# Patient Record
Sex: Male | Born: 1940
Health system: Southern US, Community
[De-identification: ages and names within clinical notes are randomized; demographics above are authoritative.]

## PROBLEM LIST (undated history)

## (undated) DIAGNOSIS — C801 Malignant (primary) neoplasm, unspecified: Secondary | ICD-10-CM

## (undated) DIAGNOSIS — I2510 Atherosclerotic heart disease of native coronary artery without angina pectoris: Secondary | ICD-10-CM

## (undated) DIAGNOSIS — Z85038 Personal history of other malignant neoplasm of large intestine: Secondary | ICD-10-CM

## (undated) DIAGNOSIS — E785 Hyperlipidemia, unspecified: Secondary | ICD-10-CM

## (undated) DIAGNOSIS — B0229 Other postherpetic nervous system involvement: Principal | ICD-10-CM

## (undated) HISTORY — DX: Hyperlipidemia, unspecified: E78.5

## (undated) HISTORY — PX: LEG SURGERY: SHX1003

## (undated) HISTORY — DX: Atherosclerotic heart disease of native coronary artery without angina pectoris: I25.10

## (undated) HISTORY — DX: Personal history of other malignant neoplasm of large intestine: Z85.038

## (undated) HISTORY — PX: HERNIA REPAIR: SHX51

## (undated) HISTORY — DX: Other postherpetic nervous system involvement: B02.29

---

## 1997-05-20 DIAGNOSIS — C801 Malignant (primary) neoplasm, unspecified: Secondary | ICD-10-CM

## 1997-05-20 HISTORY — PX: COLON SURGERY: SHX602

## 1997-05-20 HISTORY — DX: Malignant (primary) neoplasm, unspecified: C80.1

## 2002-06-07 ENCOUNTER — Ambulatory Visit (HOSPITAL_COMMUNITY): Admission: RE | Admit: 2002-06-07 | Discharge: 2002-06-07 | Payer: Self-pay | Admitting: Internal Medicine

## 2009-04-17 ENCOUNTER — Encounter: Payer: Self-pay | Admitting: Cardiology

## 2009-09-17 HISTORY — PX: CARDIAC CATHETERIZATION: SHX172

## 2009-09-24 ENCOUNTER — Encounter: Payer: Self-pay | Admitting: Physician Assistant

## 2009-09-25 ENCOUNTER — Ambulatory Visit: Payer: Self-pay | Admitting: Internal Medicine

## 2009-09-25 ENCOUNTER — Encounter: Payer: Self-pay | Admitting: Cardiovascular Disease

## 2009-09-25 ENCOUNTER — Inpatient Hospital Stay (HOSPITAL_COMMUNITY): Admission: RE | Admit: 2009-09-25 | Discharge: 2009-09-26 | Payer: Self-pay | Admitting: Internal Medicine

## 2009-09-25 ENCOUNTER — Encounter: Payer: Self-pay | Admitting: Physician Assistant

## 2009-09-26 ENCOUNTER — Encounter: Payer: Self-pay | Admitting: Physician Assistant

## 2009-10-12 ENCOUNTER — Ambulatory Visit: Payer: Self-pay | Admitting: Physician Assistant

## 2009-10-12 DIAGNOSIS — E785 Hyperlipidemia, unspecified: Secondary | ICD-10-CM

## 2009-10-12 DIAGNOSIS — I498 Other specified cardiac arrhythmias: Secondary | ICD-10-CM | POA: Insufficient documentation

## 2009-10-12 DIAGNOSIS — I2510 Atherosclerotic heart disease of native coronary artery without angina pectoris: Secondary | ICD-10-CM | POA: Insufficient documentation

## 2009-12-08 ENCOUNTER — Ambulatory Visit: Payer: Self-pay | Admitting: Cardiology

## 2010-04-10 ENCOUNTER — Encounter: Payer: Self-pay | Admitting: Cardiology

## 2010-04-17 ENCOUNTER — Encounter: Payer: Self-pay | Admitting: Cardiology

## 2010-06-19 ENCOUNTER — Ambulatory Visit
Admission: RE | Admit: 2010-06-19 | Discharge: 2010-06-19 | Payer: Self-pay | Source: Home / Self Care | Attending: Cardiology | Admitting: Cardiology

## 2010-06-19 NOTE — Letter (Signed)
Summary: External Correspondence/ [facility]  External Correspondence/ [facility]   Imported By: Dorise Hiss 04/20/2010 08:17:23  _____________________________________________________________________  External Attachment:    Type:   Image     Comment:   External Document

## 2010-06-19 NOTE — Assessment & Plan Note (Signed)
Summary: eph-post cone   Visit Type:  hospital follow-up Primary Provider:  Sasser   History of Present Illness: 70 year old male, with no prior history of heart disease, recently hospitalized with symptoms consistent with non-ST elevation myocardial infarction. He was transferred directly from [facility] ED to [facility], where he was found to have severe mid RCA stenosis with evidence of thrombus and plaque rupture. He underwent successful percutaneous intervention with a DES, and is to remain on Plavix for at least 12 months. Residual coronary anatomy indicated nonobstructive disease; LV function was preserved (EF 50% with inferoapical HK).  Patient has been doing beautifully since his release. He has not had any recurrent anterior chest discomfort. He reports no complications of the right wrist incision site. He denies any dizziness or presyncope.  Preventive Screening-Counseling & Management  Alcohol-Tobacco     Smoking Status: quit     Year Quit: 1970  Current Medications (verified): 1)  Plavix 75 Mg Tabs (Clopidogrel Bisulfate) .... Take 1 Tablet By Mouth Once A Day 2)  Metoprolol Tartrate 25 Mg Tabs (Metoprolol Tartrate) .... Take 1/2 Tablet By Mouth Two Times A Day 3)  Nitrostat 0.4 Mg Subl (Nitroglycerin) .... Place One Under Tongue For Chest Pain,repeat Every X's 3 If Chest Pain Continues. If No Relief,proceed To Ed. 4)  Crestor 40 Mg Tabs (Rosuvastatin Calcium) .... Take 1 Tablet By Mouth Once A Day 5)  Aspir-Trin 325 Mg Tbec (Aspirin) .... Take 1 Tablet By Mouth Once A Day 6)  Multivitamins  Tabs (Multiple Vitamin) .... Take 1 Tablet By Mouth Once A Day 7)  Tums 500 Mg Chew (Calcium Carbonate Antacid) .... Take 1 Tablet By Mouth Once A Day  Allergies (verified): No Known Drug Allergies  Comments:  Nurse/Medical Assistant: The patient's medications and allergies were reviewed with the patient and were updated in the Medication and Allergy Lists. List  reviewed.  Past History:  Past Medical History: CAD... NSTEMI/DES RCA, 5/11 EF 50%; inferoapical HK Dyslipidemia Remote tobacco  Social History: Smoking Status:  quit  Review of Systems       No fevers, chills, hemoptysis, dysphagia, melena, hematocheezia, hematuria, rash, claudication, orthopnea, pnd, pedal edema. All other systems negative.   Vital Signs:  Patient profile:   70 year old male Height:      73 inches Weight:      180 pounds BMI:     23.83 Pulse rate:   44 / minute BP sitting:   98 / 55  (left arm) Cuff size:   regular  Vitals Entered By: Carlye Grippe (Oct 12, 2009 1:13 PM)  Physical Exam  Additional Exam:  GEN:70 year old male in no distress HEENT: NCAT,PERRLA,EOMI NECK: palpable pulses, no bruits; no JVD; no TM LUNGS: CTA bilaterally HEART: RRR (S1S2); no significant murmurs; no rubs; no gallops ABD: soft, NT; intact BS ZOX:WRUEAV right wrist incision site, with no ecchymosis or hematoma; intact distal pulses; no edema SKIN: warm, dry MUSC: no obvious deformity NEURO: A/O (x3)     EKG  Procedure date:  10/12/2009  Findings:      marked sinus bradycardia at 44 bpm; normal axis; nonspecific ST changes  Impression & Recommendations:  Problem # 1:  CAD (ICD-414.00) patient is doing extremely well since undergoing treatment for a non-ST elevation infarction, secondary to high-grade stenosis of the mid RCA. This was successfully treated with balloon angioplasty and placement of a drug-eluting stent. He's not had any recurrent symptoms, and has resumed previous activities. He is to remain on  Plavix for at least 12 months. We'll schedule return clinic visit with myself in 2 months, and have him subsequently establish with Dr. Andee Lineman.  Problem # 2:  BRADYCARDIA (ICD-427.89) patient denies any associated symptoms. I considered cutting  back on his current metoprolol dose, which is only 12.5 b.i.d. Rather, we agreed to continue monitoring this closely  and, if he were to develop any symptoms suggestive of presyncope or easy fatigability, that we would then do so at that time.  Problem # 3:  DYSLIPIDEMIA (ICD-272.4) aggressive lipid management recommended, with target LDL of 70 or less, if feasible. Recent lipid profile revealed an LDL of 104. We'll repeat a profile in 2 months, at time of his next clinic visit.  Other Orders: EKG w/ Interpretation (93000)  Patient Instructions: 1)  FOLLOW UP WITH DR. Andee Lineman ON FRIDAY JULY 22 AT 8:45.  2)  Your physician recommends that you continue on your current medications as directed. Please refer to the Current Medication list given to you today.

## 2010-06-19 NOTE — Cardiovascular Report (Signed)
Summary: Cardiac Cath   Cardiac Cath   Imported By: Zachary George 10/12/2009 13:34:11  _____________________________________________________________________  External Attachment:    Type:   Image     Comment:   External Document

## 2010-06-19 NOTE — Assessment & Plan Note (Signed)
Summary: 2 mo fu   Visit Type:  Follow-up Primary Provider:  Sasser   History of Present Illness: the patient is a 70 year old male previously hospitalized with a non-ST elevation myocardial infarction. Status post stent placement to the mid right coronary artery with a drug-eluting stent. He's been doing well. He denies any chest pain short of breath orthopnea or PND. During his last office visit he was found to be bradycardic. No adjustment was made in his metoprolol. The patient's heart rate now however is 38 beats per minute in the office by EKG. He has a relatively low blood pressure. He does complain of fatigue and weakness and decreased exercise tolerance.  Preventive Screening-Counseling & Management  Alcohol-Tobacco     Smoking Status: quit     Year Quit: 1970  Current Medications (verified): 1)  Plavix 75 Mg Tabs (Clopidogrel Bisulfate) .... Take 1 Tablet By Mouth Once A Day 2)  Nitrostat 0.4 Mg Subl (Nitroglycerin) .... Place One Under Tongue For Chest Pain,repeat Every X's 3 If Chest Pain Continues. If No Relief,proceed To Ed. 3)  Crestor 40 Mg Tabs (Rosuvastatin Calcium) .... Take 1 Tablet By Mouth Once A Day 4)  Aspir-Trin 325 Mg Tbec (Aspirin) .... Take 1 Tablet By Mouth Once A Day 5)  Multivitamins  Tabs (Multiple Vitamin) .... Take 1 Tablet By Mouth Once A Day 6)  Tums 500 Mg Chew (Calcium Carbonate Antacid) .... Take 1 Tablet By Mouth Once A Day  Allergies (verified): No Known Drug Allergies  Comments:  Nurse/Medical Assistant: The patient's medication list and allergies were reviewed with the patient and were updated in the Medication and Allergy Lists.  Past History:  Past Medical History: Last updated: 10/12/2009 CAD... NSTEMI/DES RCA, 5/11 EF 50%; inferoapical HK Dyslipidemia Remote tobacco  Past Surgical History: Last updated: 10/12/2009  Status post hemicolectomy in 1999, with a history of colon cancer.  Status post left hernia repair.    Family History: Last updated: 10/12/2009   His father had a coronary bypass graft in 1970,   otherwise no early coronary artery disease.  Father had colon cancer     Social History: Last updated: 10/12/2009   Lives in [place] with his wife.  He is currently   self-employed.  He smoked a pack of cigarettes per day for 10 years and   quit in 1970.     Risk Factors: Smoking Status: quit (12/08/2009)  Review of Systems       The patient complains of fatigue.  The patient denies malaise, fever, weight gain/loss, vision loss, decreased hearing, hoarseness, chest pain, palpitations, shortness of breath, prolonged cough, wheezing, sleep apnea, coughing up blood, abdominal pain, blood in stool, nausea, vomiting, diarrhea, heartburn, incontinence, blood in urine, muscle weakness, joint pain, leg swelling, rash, skin lesions, headache, fainting, dizziness, depression, anxiety, enlarged lymph nodes, easy bruising or bleeding, and environmental allergies.    Vital Signs:  Patient profile:   70 year old male Height:      73 inches Weight:      179 pounds Pulse rate:   40 / minute BP sitting:   108 / 62  (left arm) Cuff size:   regular  Vitals Entered By: Carlye Grippe (December 08, 2009 8:35 AM)  Physical Exam  Additional Exam:  GEN:70 year old male in no distress HEENT: NCAT,PERRLA,EOMI NECK: palpable pulses, no bruits; no JVD; no TM LUNGS: CTA bilaterally HEART: RRR (S1S2); no significant murmurs; no rubs; no gallops ABD: soft, NT; intact BS ZOX:WRUEAV right wrist  incision site, with no ecchymosis or hematoma; intact distal pulses; no edema SKIN: warm, dry MUSC: no obvious deformity NEURO: A/O (x3)     Impression & Recommendations:  Problem # 1:  BRADYCARDIA (ICD-427.89) we'll discontinue beta blocker. The patient's wife will keep a record of his blood pressure and heart rate. If he remains bradycardic a Holter monitor will be applied an exercise test will be performed to  evaluate chronotropic insufficiency. The following medications were removed from the medication list:    Metoprolol Tartrate 25 Mg Tabs (Metoprolol tartrate) .Marland Kitchen... Take 1/2 tablet by mouth two times a day His updated medication list for this problem includes:    Plavix 75 Mg Tabs (Clopidogrel bisulfate) .Marland Kitchen... Take 1 tablet by mouth once a day    Nitrostat 0.4 Mg Subl (Nitroglycerin) .Marland Kitchen... Place one under tongue for chest pain,repeat every x's 3 if chest pain continues. if no relief,proceed to ed.    Aspir-trin 325 Mg Tbec (Aspirin) .Marland Kitchen... Take 1 tablet by mouth once a day  Orders: EKG w/ Interpretation (93000)  Problem # 2:  DYSLIPIDEMIA (ICD-272.4) continue medical therapy with Crestor. His updated medication list for this problem includes:    Crestor 40 Mg Tabs (Rosuvastatin calcium) .Marland Kitchen... Take 1 tablet by mouth once a day  Problem # 3:  CAD (ICD-414.00) no recurrent chest pain. Ejection fraction is stable at 50%. The following medications were removed from the medication list:    Metoprolol Tartrate 25 Mg Tabs (Metoprolol tartrate) .Marland Kitchen... Take 1/2 tablet by mouth two times a day His updated medication list for this problem includes:    Plavix 75 Mg Tabs (Clopidogrel bisulfate) .Marland Kitchen... Take 1 tablet by mouth once a day    Nitrostat 0.4 Mg Subl (Nitroglycerin) .Marland Kitchen... Place one under tongue for chest pain,repeat every x's 3 if chest pain continues. if no relief,proceed to ed.    Aspir-trin 325 Mg Tbec (Aspirin) .Marland Kitchen... Take 1 tablet by mouth once a day  Orders: EKG w/ Interpretation (93000)  Patient Instructions: 1)  Stop Metoprolol 2)  Follow up in  6 months

## 2010-06-27 NOTE — Assessment & Plan Note (Signed)
Summary: 6 mo fu   Visit Type:  Follow-up Primary Provider:  Neita Carp   History of Present Illness: the patient is a 70 year old male with a history of coronary artery disease status-post drug-eluting stent placement to the mid RCA May 2011.  The patient has mild LV systolic dysfunction actually upper limit of normal at 50%.  The patient denies any chest pain shortness of breath orthopnea PND.  He has no palpitations or syncope.  He is doing remarkably well.  He did bring in EKG from his primary care physician after routine physical and was concerned about the reading on the tracing.  I told him that in essence his electrocardiogram was within normal limits and that a Q wave in lead 3 is not necessarily pathologic. The patient apparently also had a lipid panel done with a total cholesterol of 96 mg percent.  I don't have the actual lab results available from [name].  The patient also state that he has had some difficulty refilling his Plavix with our office.  I discussed with the patient the need to continue Plavix for at least a full year possibly two years after drug-eluting stent placement and to notify us if he is planning on elective procedures including endoscopy, epidural injections or teeth extractions.  He should not stop Plavix unless discussed with our practice.  Preventive Screening-Counseling & Management  Alcohol-Tobacco     Smoking Status: quit     Year Quit: 1970  Current Medications (verified): 1)  Plavix 75 Mg Tabs (Clopidogrel Bisulfate) .... Take 1 Tablet By Mouth Once A Day 2)  Nitrostat 0.4 Mg Subl (Nitroglycerin) .... Place One Under Tongue For Chest Pain,repeat Every X's 3 If Chest Pain Continues. If No Relief,proceed To Ed. 3)  Crestor 40 Mg Tabs (Rosuvastatin Calcium) .... Take 1/2 Tab (20mg ) At Bedtime 4)  Aspir-Trin 325 Mg Tbec (Aspirin) .... Take 1 Tablet By Mouth Once A Day 5)  Multivitamins  Tabs (Multiple Vitamin) .... Take 1 Tablet By Mouth Once A  Day 6)  Tums 500 Mg Chew (Calcium Carbonate Antacid) .... Take 1 Tablet By Mouth Once A Day  Allergies: No Known Drug Allergies  Past History:  Past Medical History: Last updated: 10/12/2009 CAD... NSTEMI/DES RCA, 5/11 EF 50%; inferoapical HK Dyslipidemia Remote tobacco  Past Surgical History: Last updated: 10/12/2009  Status post hemicolectomy in 1999, with a history of colon cancer.  Status post left hernia repair.   Family History: Last updated: 10/12/2009   His father had a coronary bypass graft in 1970,   otherwise no early coronary artery disease.  Father had colon cancer     Social History: Last updated: 10/12/2009   Lives in [place] with his wife.  He is currently   self-employed.  He smoked a pack of cigarettes per day for 10 years and   quit in 1970.     Risk Factors: Smoking Status: quit (06/19/2010)  Review of Systems  The patient denies fatigue, malaise, fever, weight gain/loss, vision loss, decreased hearing, hoarseness, chest pain, palpitations, shortness of breath, prolonged cough, wheezing, sleep apnea, coughing up blood, abdominal pain, blood in stool, nausea, vomiting, diarrhea, heartburn, incontinence, blood in urine, muscle weakness, joint pain, leg swelling, rash, skin lesions, headache, fainting, dizziness, depression, anxiety, enlarged lymph nodes, easy bruising or bleeding, and environmental allergies.    Vital Signs:  Patient profile:   70 year old male Height:      73 inches Weight:      184 pounds  Pulse rate:   62 / minute BP sitting:   127 / 74  (left arm) Cuff size:   regular  Vitals Entered By: Carlye Grippe (June 19, 2010 9:55 AM)  Physical Exam  Additional Exam:  General: Well-developed, well-nourished in no distress head: Normocephalic and atraumatic eyes PERRLA/EOMI intact, conjunctiva and lids normal nose: No deformity or lesions mouth normal dentition, normal posterior pharynx neck: Supple, no JVD.  No masses,  thyromegaly or abnormal cervical nodes lungs: Normal breath sounds bilaterally without wheezing.  Normal percussion heart: regular rate and rhythm with normal S1 and S2, no S3 or S4.  PMI is normal.  No pathological murmurs abdomen: Normal bowel sounds, abdomen is soft and nontender without masses, organomegaly or hernias noted.  No hepatosplenomegaly musculoskeletal: Back normal, normal gait muscle strength and tone normal pulsus: Pulse is normal in all 4 extremities Extremities: No peripheral pitting edema neurologic: Alert and oriented x 3 skin: Intact without lesions or rashes cervical nodes: No significant adenopathy psychologic: Normal affect    Impression & Recommendations:  Problem # 1:  DYSLIPIDEMIA (ICD-272.4) the patient lipid panel is noted for a low total cholesterol.  The patient can be decreased to Crestor 20 mg p.o. daily. His updated medication list for this problem includes:    Crestor 40 Mg Tabs (Rosuvastatin calcium) .Marland Kitchen... Take 1/2 tab (20mg ) at bedtime  Problem # 2:  CAD (ICD-414.00) status post drug-eluting stent placement to the mid RCA.  No recurrent chest pain continue Plavix with the provisions as outlined in the history His updated medication list for this problem includes:    Plavix 75 Mg Tabs (Clopidogrel bisulfate) .Marland Kitchen... Take 1 tablet by mouth once a day    Nitrostat 0.4 Mg Subl (Nitroglycerin) .Marland Kitchen... Place one under tongue for chest pain,repeat every x's 3 if chest pain continues. if no relief,proceed to ed.    Aspir-trin 325 Mg Tbec (Aspirin) .Marland Kitchen... Take 1 tablet by mouth once a day  Problem # 3:  BRADYCARDIA (ICD-427.89) no evidence of recurrent bradycardia.  Patient is asymptomatic. His updated medication list for this problem includes:    Plavix 75 Mg Tabs (Clopidogrel bisulfate) .Marland Kitchen... Take 1 tablet by mouth once a day    Nitrostat 0.4 Mg Subl (Nitroglycerin) .Marland Kitchen... Place one under tongue for chest pain,repeat every x's 3 if chest pain  continues. if no relief,proceed to ed.    Aspir-trin 325 Mg Tbec (Aspirin) .Marland Kitchen... Take 1 tablet by mouth once a day  Patient Instructions: 1)  Decrease Crestor to 20mg  at bedtime 2)  Plavix refill given  3)  Follow up in  1 year Prescriptions: PLAVIX 75 MG TABS (CLOPIDOGREL BISULFATE) Take 1 tablet by mouth once a day  #90 x 3   Entered by:   Hoover Brunette, LPN   Authorized by:   Lewayne Bunting, MD, [facility]   Signed by:   Hoover Brunette, LPN on 36/64/4034   Method used:   Electronically to        [facility] (retail)       [address]       [place], [place]  [address]       Ph: [phone]       Fax: [phone]   RxID:   [id number]

## 2010-08-03 ENCOUNTER — Encounter: Payer: Self-pay | Admitting: Cardiology

## 2010-08-07 LAB — CBC
HCT: 43.4 % (ref 39.0–52.0)
Hemoglobin: 15.3 g/dL (ref 13.0–17.0)
MCHC: 35 g/dL (ref 30.0–36.0)
MCHC: 35.1 g/dL (ref 30.0–36.0)
MCV: 93.9 fL (ref 78.0–100.0)
MCV: 94.3 fL (ref 78.0–100.0)
Platelets: 212 10*3/uL (ref 150–400)
RBC: 4.62 MIL/uL (ref 4.22–5.81)

## 2010-08-07 LAB — LIPID PANEL
LDL Cholesterol: 104 mg/dL — ABNORMAL HIGH (ref 0–99)
VLDL: 14 mg/dL (ref 0–40)

## 2010-08-07 LAB — BASIC METABOLIC PANEL
BUN: 10 mg/dL (ref 6–23)
CO2: 24 mEq/L (ref 19–32)
Chloride: 109 mEq/L (ref 96–112)
Creatinine, Ser: 0.98 mg/dL (ref 0.4–1.5)
Glucose, Bld: 101 mg/dL — ABNORMAL HIGH (ref 70–99)

## 2010-08-07 LAB — CARDIAC PANEL(CRET KIN+CKTOT+MB+TROPI)
CK, MB: 4 ng/mL (ref 0.3–4.0)
CK, MB: 4.2 ng/mL — ABNORMAL HIGH (ref 0.3–4.0)
Relative Index: INVALID (ref 0.0–2.5)
Total CK: 89 U/L (ref 7–232)
Troponin I: 0.7 ng/mL (ref 0.00–0.06)
Troponin I: 0.78 ng/mL (ref 0.00–0.06)

## 2010-08-07 LAB — HEMOGLOBIN A1C: Hgb A1c MFr Bld: 5.2 % (ref <5.7)

## 2010-10-05 NOTE — Op Note (Signed)
[name], Seth Higgins                            ACCOUNT NO.:  [id number]   MEDICAL RECORD NO.:  [id number]                   PATIENT TYPE:   LOCATION:                                       FACILITY:   PHYSICIAN:  Lionel December, M.D.                 DATE OF BIRTH:   DATE OF PROCEDURE:  06/07/2002  DATE OF DISCHARGE:                                 OPERATIVE REPORT   PROCEDURE:  Total colonoscopy.   INDICATIONS:  The patient is undergoing surveillance examination.  He had  low anterior resection in May 1999 for adenocarcinoma.  He is doing well and  does not have any GI symptoms.  His family history is positive for colon  carcinoma in his father.  The procedure was reviewed with the patient, and  informed consent was obtained.   PREMEDICATION:  Demerol 25 mg IV, Versed 3 mg IV in divided dose.   INSTRUMENT USED:  Olympus video system.   FINDINGS:  Procedure performed in endoscopy suite.  The patient's vital  signs and O2 saturation were monitored during the procedure and remained  stable.  The patient was placed in the left lateral recumbent position and  rectal examination was performed.  No abnormality noted on external or  digital exam.  The scope was placed in the rectum and advanced under vision  to the sigmoid colon and beyond.  The scope was passed to the cecum, which  was identified by ileocecal valve and appendiceal stump.  Short segment of  TI was also examined and was normal.  As the scope was withdrawn, colonic  mucosa was carefully examined.  There was a small polyp at the transverse  colon, which was ablated by cold biopsy.  Mucosa of the rest of the colon  was normal.  Colorectal anastomosis was located at 12 cm from the anal  margin.  It was marked by a tiny diverticulum.  The rectal mucosa was  normal.  The scope was retroflexed to examine the anorectal junction, and  small hemorrhoids were noted below the dentate line.  Endoscope was  straightened and withdrawn.   The patient tolerated the procedure well.   FINAL DIAGNOSES:  1. Examination performed to cecum.  2. Single small polyp ablated by cold biopsy from the transverse colon.  3. Small external hemorrhoids.  4. Wide-open colorectal anastomosis located at 12 cm from the anal margin.    RECOMMENDATIONS:  1. Standard instructions given.  2. I will contact the patient with biopsy results.  3. He should consider having repeat exam in three to four years from now.                                               Lionel December, M.D.  NR/MEDQ  D:  06/07/2002  T:  06/07/2002  Job:  [id number]   cc:   Fara Chute  [address] [place]  [place] [address]  Fax: [phone]   Kathaleen Maser. Cleotis Nipper, M.D.  [address]  [place]  [place] [address]  Fax: [phone]

## 2011-02-04 ENCOUNTER — Other Ambulatory Visit: Payer: Self-pay | Admitting: *Deleted

## 2011-02-04 MED ORDER — CLOPIDOGREL BISULFATE 75 MG PO TABS: 75.0000 mg | ORAL_TABLET | Freq: Every day | ORAL | Status: DC

## 2011-06-24 ENCOUNTER — Other Ambulatory Visit: Payer: Self-pay | Admitting: Cardiology

## 2011-09-27 ENCOUNTER — Other Ambulatory Visit: Payer: Self-pay | Admitting: Cardiology

## 2011-10-08 ENCOUNTER — Other Ambulatory Visit: Payer: Self-pay | Admitting: *Deleted

## 2011-10-08 MED ORDER — ROSUVASTATIN CALCIUM 20 MG PO TABS: 20.0000 mg | ORAL_TABLET | Freq: Every day | ORAL | Status: DC

## 2011-10-08 NOTE — Telephone Encounter (Signed)
Seth Higgins needs refill on Crestor. [facility] [place], [place] Made appt for him to see Dr. Andee Lineman on 10-31-2011

## 2011-10-28 ENCOUNTER — Encounter: Payer: Self-pay | Admitting: *Deleted

## 2011-10-31 ENCOUNTER — Ambulatory Visit (INDEPENDENT_AMBULATORY_CARE_PROVIDER_SITE_OTHER): Payer: Medicare Other | Admitting: Cardiology

## 2011-10-31 ENCOUNTER — Encounter: Payer: Self-pay | Admitting: Cardiology

## 2011-10-31 VITALS — BP 123/69 | HR 69 | Ht 73.0 in | Wt 179.8 lb

## 2011-10-31 DIAGNOSIS — I498 Other specified cardiac arrhythmias: Secondary | ICD-10-CM

## 2011-10-31 DIAGNOSIS — I2510 Atherosclerotic heart disease of native coronary artery without angina pectoris: Secondary | ICD-10-CM

## 2011-10-31 DIAGNOSIS — R001 Bradycardia, unspecified: Secondary | ICD-10-CM

## 2011-10-31 DIAGNOSIS — E785 Hyperlipidemia, unspecified: Secondary | ICD-10-CM

## 2011-10-31 MED ORDER — ROSUVASTATIN CALCIUM 10 MG PO TABS: 10.0000 mg | ORAL_TABLET | Freq: Every evening | ORAL | Status: DC

## 2011-10-31 NOTE — Patient Instructions (Signed)
   Stop Plavix  Decrease Crestor to 10mg  every evening  Stop Fish Oil   Follow up labs with [name]  Your physician wants you to follow up in:  1 year.  You will receive a reminder letter in the mail one-two months in advance.  If you don't receive a letter, please call our office to schedule the follow up appointment

## 2011-10-31 NOTE — Progress Notes (Signed)
Seth Bottoms, MD, [facility] Board Certified in Adult Cardiovascular Medicine,Internal Medicine and Critical Care Medicine    CC: Followup patient history of coronary artery disease  HPI:  The patient is a 71 year old male with history of stent to the RCA 2011. He has done remarkably well. He is very active has no limitations in daily activities. He reports no shortness of breath chest pain orthopnea or PND. No palpitations or syncope. Lipid panels followed by [name]. However the patient has stopped taking fish oil because it made him smell like "fish". He also wants to go back on his Crestor to 10 mg by mouth daily. I told the patient to make sure he follows up laboratory work with [name] to keep his LDL cholesterol below 75 mg percent.   PMH: reviewed and listed in Problem List in Electronic Records (and see below) Past Medical History  Diagnosis Date  . CAD (coronary artery disease)     NSTEMI/DES RCA, 05/11. EF 50%,inferoapical HK  . Dyslipidemia   . History of colon cancer    Past Surgical History  Procedure Date  . Colon surgery 1999  . Hernia repair     LEFT    Allergies/SH/FHX : available in Electronic Records for review  No Known Allergies History   Social History  . Marital Status: Married    Spouse Name: [name]    Number of Children: [name]  . Years of Education: [name]   Occupational History  . Not on file.   Social History Main Topics  . Smoking status: Former Smoker -- 1.0 packs/day for 10 years    Types: Cigarettes    Quit date: 05/20/1968  . Smokeless tobacco: Never Used  . Alcohol Use: Not on file  . Drug Use: Not on file  . Sexually Active: Not on file   Other Topics Concern  . Not on file   Social History Narrative  . No narrative on file   Family History  Problem Relation Age of Onset  . Coronary artery disease Father     had CABG in 1970  . Colon cancer Father     Medications: Current Outpatient Prescriptions  Medication Sig  Dispense Refill  . aspirin 81 MG tablet Take 81 mg by mouth daily.      . calcium carbonate (TUMS - DOSED IN MG ELEMENTAL CALCIUM) 500 MG chewable tablet Chew 1 tablet by mouth daily.      . Multiple Vitamin (MULTIVITAMIN) tablet Take 1 tablet by mouth daily.      . nitroGLYCERIN (NITROSTAT) 0.4 MG SL tablet Place 0.4 mg under the tongue every 5 (five) minutes as needed.      . rosuvastatin (CRESTOR) 10 MG tablet Take 1 tablet (10 mg total) by mouth every evening.  30 tablet  6  . DISCONTD: rosuvastatin (CRESTOR) 20 MG tablet Take 1 tablet (20 mg total) by mouth daily.  30 tablet  1    ROS: No nausea or vomiting. No fever or chills.No melena or hematochezia.No bleeding.No claudication  Physical Exam: BP 123/69  Pulse 69  Ht 6\' 1"  (1.854 m)  Wt 179 lb 12.8 oz (81.557 kg)  BMI 23.72 kg/m2 General: Well-nourished white male in no distress. Neck: Normal carotid upstroke no carotid bruits. No thyromegaly nonnodular thyroid. Lungs: Clear breath sounds bilaterally without wheezing Cardiac: Regular rate and rhythm with normal S1-S2 no murmurs or gallops Vascular: No edema. Normal distal pulses Skin: Warm and dry Physcologic: Normal affect.  12lead ECG:  Normal sinus rhythm with no acute ischemic changes otherwise normal tracing Limited bedside ECHO:[name] No images are attached to the encounter.   Assessment and Plan  CAD The patient reports no recurrent chest pain. His 2 years out from stent placement and we will stop his Plavix. He can continue aspirin 81 mg by mouth daily. I also told the patient that he will receive an ischemia evaluation in one year.  DYSLIPIDEMIA Followed by [name]. The patient was started on fish oil but stated that he starts feeling like "fish". I told the patient currently is no clinical data to support the use of the shoulder even in the setting of hypertriglyceridemia  BRADYCARDIA No evidence of recurrent bradycardias. EKG was reviewed and is within normal  limits.   Patient Active Problem List  Diagnosis  . DYSLIPIDEMIA  . CAD  . BRADYCARDIA

## 2011-10-31 NOTE — Assessment & Plan Note (Signed)
Followed by [name]. The patient was started on fish oil but stated that he starts feeling like "fish". I told the patient currently is no clinical data to support the use of the shoulder even in the setting of hypertriglyceridemia

## 2011-10-31 NOTE — Assessment & Plan Note (Signed)
No evidence of recurrent bradycardias. EKG was reviewed and is within normal limits.

## 2011-10-31 NOTE — Assessment & Plan Note (Signed)
The patient reports no recurrent chest pain. His 2 years out from stent placement and we will stop his Plavix. He can continue aspirin 81 mg by mouth daily. I also told the patient that he will receive an ischemia evaluation in one year.

## 2012-01-08 ENCOUNTER — Encounter (INDEPENDENT_AMBULATORY_CARE_PROVIDER_SITE_OTHER): Payer: Self-pay | Admitting: *Deleted

## 2012-03-17 ENCOUNTER — Telehealth: Payer: Self-pay | Admitting: Physician Assistant

## 2012-03-17 NOTE — Telephone Encounter (Signed)
Discussed below with patient.  Advised patient to go to ED - stated he did not want to go there.  Encouraged him to be seen by PMD, urgent care or ED today, as the office is not where he needs to be with acute symptoms.  Denies any chest pain currently.  OV scheduled for Friday, 11/1 with Gene Serpe, PA.

## 2012-03-17 NOTE — Telephone Encounter (Signed)
Patient walk in wanting to discuss symptoms that he has been experiencing.  Started having SOB,[place] and just break out into sweats felt weak when happened. These are same symptoms that he had when he had to have stints placed a few years ago. He is really concerned with this and wants someone to call him asap  Call cell phone first then home. Advised that he is to go to Hospital if he worsens.

## 2012-03-20 ENCOUNTER — Encounter: Payer: Self-pay | Admitting: Physician Assistant

## 2012-03-20 ENCOUNTER — Encounter: Payer: Self-pay | Admitting: *Deleted

## 2012-03-20 ENCOUNTER — Telehealth: Payer: Self-pay | Admitting: Physician Assistant

## 2012-03-20 ENCOUNTER — Ambulatory Visit (INDEPENDENT_AMBULATORY_CARE_PROVIDER_SITE_OTHER): Payer: Medicare Other | Admitting: Physician Assistant

## 2012-03-20 VITALS — BP 113/71 | HR 66 | Ht 73.0 in | Wt 178.8 lb

## 2012-03-20 DIAGNOSIS — I2510 Atherosclerotic heart disease of native coronary artery without angina pectoris: Secondary | ICD-10-CM

## 2012-03-20 DIAGNOSIS — R072 Precordial pain: Secondary | ICD-10-CM

## 2012-03-20 DIAGNOSIS — Z01810 Encounter for preprocedural cardiovascular examination: Secondary | ICD-10-CM

## 2012-03-20 MED ORDER — NITROGLYCERIN 0.4 MG SL SUBL: 0.4000 mg | SUBLINGUAL_TABLET | SUBLINGUAL | Status: AC | PRN

## 2012-03-20 NOTE — Assessment & Plan Note (Signed)
Recommendation is to proceed with an elective cardiac catheterization in the [facility] lab, early next week. Patient presents with symptoms reminiscent of prior NSTEMI presentation back in 2011, at which time he was found to have severe mid RCA stenosis, successfully treated with drug-eluting stenting. In addition, he now presents with new onset nocturnal angina, which has resolved spontaneously. He has not had an episode in 2 weeks. Given his recent symptoms, I have recommended aggressive evaluation, rather than a stress test, and the patient has concurred. The risks/benefits were discussed, and the plan was approved by [name].

## 2012-03-20 NOTE — Telephone Encounter (Signed)
Pt has Blue MCR.   No precert for heart cath

## 2012-03-20 NOTE — Progress Notes (Signed)
Primary Cardiologist: [name], MD (new)   HPI: Patient seen as an add-on for recent development of DOE, as well as 2 separate episodes of nausea/diaphoresis and new onset nocturnal CP.  Patient states that the episodes of nausea/diaphoresis, with no associated CP, are reminiscent of what he experienced when he presented with NSTEMI in May 2011. He was found to have severe RCA stenosis, successfully treated with a single DES. Residual nonobstructive CAD; EF 50%.  Over the last 3 weeks, he has noted development of exertional dyspnea and, in addition to the 2 episodes of nausea/diaphoresis, also has experienced 2 separate episodes of CP which awoke him during the middle the night. These were of mild intensity (3/10), but new and not similar to prior experiences with heartburn. These episodes were not associated with nausea/diaphoresis, nor with radiation to the jaw or upper extremities. They resolved spontaneously in less than 5 minutes. His last episode was approximately 2 weeks ago. Again, he denies any exertional CP, and did not experience any CP when he presented back in 2011.  12-lead EKG today, reviewed by me, indicates NSR 66 bpm; less than 1 mm J-point elevation in inferior leads with otherwise nonspecific ST changes in the high lateral leads.  No Known Allergies  Current Outpatient Prescriptions  Medication Sig Dispense Refill  . aspirin 81 MG tablet Take 81 mg by mouth daily.      . calcium carbonate (TUMS - DOSED IN MG ELEMENTAL CALCIUM) 500 MG chewable tablet Chew 1 tablet by mouth daily.      . rosuvastatin (CRESTOR) 10 MG tablet Take 1 tablet (10 mg total) by mouth every evening.  30 tablet  6  . nitroGLYCERIN (NITROSTAT) 0.4 MG SL tablet Place 1 tablet (0.4 mg total) under the tongue every 5 (five) minutes as needed.  25 tablet  3  . DISCONTD: nitroGLYCERIN (NITROSTAT) 0.4 MG SL tablet Place 0.4 mg under the tongue every 5 (five) minutes as needed.        Past Medical History    Diagnosis Date  . CAD (coronary artery disease)     NSTEMI/DES RCA, 05/11. EF 50%,inferoapical HK  . Dyslipidemia   . History of colon cancer     Past Surgical History  Procedure Date  . Colon surgery 1999  . Hernia repair     LEFT    History   Social History  . Marital Status: Married    Spouse Name: [name]    Number of Children: [name]  . Years of Education: [name]   Occupational History  . Not on file.   Social History Main Topics  . Smoking status: Former Smoker -- 1.0 packs/day for 10 years    Types: Cigarettes    Quit date: 05/20/1968  . Smokeless tobacco: Never Used  . Alcohol Use: Not on file  . Drug Use: Not on file  . Sexually Active: Not on file   Other Topics Concern  . Not on file   Social History Narrative  . No narrative on file   Social History Narrative  . No narrative on file    Problem Relation Age of Onset  . Coronary artery disease Father     had CABG in 1970  . Colon cancer Father     ROS: no nausea, vomiting; no fever, chills; no melena, hematochezia; no claudication  PHYSICAL EXAM: BP 113/71  Pulse 66  Ht 6\' 1"  (1.854 m)  Wt 178 lb 12.8 oz (81.103 kg)  BMI 23.59 kg/m2  SpO2 98% GENERAL: 71 year old male; NAD HEENT: NCAT, PERRLA, EOMI; sclera clear; no xanthelasma NECK: palpable bilateral carotid pulses, no bruits; no JVD; no TM LUNGS: CTA bilaterally CARDIAC: RRR (S1, S2); no significant murmurs; no rubs or gallops ABDOMEN: soft, non-tender; intact BS EXTREMETIES: intact distal pulses; no significant peripheral edema SKIN: warm/dry; no obvious rash/lesions MUSCULOSKELETAL: no joint deformity NEURO: no focal deficit; NL affect   EKG: reviewed and available in Electronic Records   ASSESSMENT & PLAN:  CAD Recommendation is to proceed with an elective cardiac catheterization in the [facility] lab, early next week. Patient presents with symptoms reminiscent of prior NSTEMI presentation back in 2011, at which time he was found to have  severe mid RCA stenosis, successfully treated with drug-eluting stenting. In addition, he now presents with new onset nocturnal angina, which has resolved spontaneously. He has not had an episode in 2 weeks. Given his recent symptoms, I have recommended an aggressive evaluation, rather than a stress test, for definitive exclusion of significant CAD progression. The patient has concurred with this recommendation, and the risks/benefits of the procedure were reviewed. The plan was reviewed with and approved by [name], with whom patient will establish in the near future. Patient has been strongly advised to proceed directly to the ED in the intervening period, if he were to have recurrent symptoms unrelieved by 3 NTG tablets.    Gene Ellia Knowlton, PAC

## 2012-03-20 NOTE — Patient Instructions (Signed)
Continue all current medications. Left JV heart cath - see info sheet given Nitroglycerin refill sent to pharmacy Follow up will be given after above

## 2012-03-20 NOTE — Telephone Encounter (Signed)
L JV Cath - 11/7 - 8:30 - Seth Higgins

## 2012-03-23 LAB — PROTIME-INR

## 2012-03-26 ENCOUNTER — Inpatient Hospital Stay (HOSPITAL_BASED_OUTPATIENT_CLINIC_OR_DEPARTMENT_OTHER)
Admission: RE | Admit: 2012-03-26 | Discharge: 2012-03-26 | Disposition: A | Discharge status: Home / Self Care | Payer: Medicare Other | Source: Ambulatory Visit | Attending: Cardiology | Admitting: Cardiology

## 2012-03-26 ENCOUNTER — Encounter (HOSPITAL_BASED_OUTPATIENT_CLINIC_OR_DEPARTMENT_OTHER)
Admission: RE | Disposition: A | Discharge status: Home / Self Care | Payer: Self-pay | Source: Ambulatory Visit | Attending: Cardiology

## 2012-03-26 DIAGNOSIS — R072 Precordial pain: Secondary | ICD-10-CM

## 2012-03-26 DIAGNOSIS — R079 Chest pain, unspecified: Secondary | ICD-10-CM | POA: Insufficient documentation

## 2012-03-26 DIAGNOSIS — Z9861 Coronary angioplasty status: Secondary | ICD-10-CM | POA: Insufficient documentation

## 2012-03-26 DIAGNOSIS — I2510 Atherosclerotic heart disease of native coronary artery without angina pectoris: Secondary | ICD-10-CM

## 2012-03-26 SURGERY — JV LEFT HEART CATHETERIZATION WITH CORONARY ANGIOGRAM
Anesthesia: Moderate Sedation

## 2012-03-26 MED ORDER — SODIUM CHLORIDE 0.9 % IV SOLN: INTRAVENOUS | Status: AC

## 2012-03-26 MED ORDER — SODIUM CHLORIDE 0.9 % IJ SOLN
3.0000 mL | Freq: Two times a day (BID) | INTRAMUSCULAR | Status: DC
Start: 2012-03-26 — End: 2012-03-26

## 2012-03-26 MED ORDER — SODIUM CHLORIDE 0.9 % IV SOLN: 250.0000 mL | INTRAVENOUS | Status: DC | PRN

## 2012-03-26 MED ORDER — ASPIRIN 81 MG PO CHEW
324.0000 mg | CHEWABLE_TABLET | ORAL | Status: AC
  Administered 2012-03-26: 324 mg via ORAL

## 2012-03-26 MED ORDER — ACETAMINOPHEN 325 MG PO TABS: 650.0000 mg | ORAL_TABLET | ORAL | Status: DC | PRN

## 2012-03-26 MED ORDER — SODIUM CHLORIDE 0.9 % IJ SOLN: 3.0000 mL | INTRAMUSCULAR | Status: DC | PRN

## 2012-03-26 MED ORDER — SODIUM CHLORIDE 0.9 % IV SOLN: INTRAVENOUS | Status: DC

## 2012-03-26 NOTE — CV Procedure (Signed)
   Cardiac Catheterization Procedure Note  Name: Seth Higgins MRN: [id number] DOB: 1940/10/29  Procedure: Left Heart Cath, Selective Coronary Angiography, LV angiography  Indication: Chest pain with prior stent procedure   Procedural details: The right groin was prepped, draped, and anesthetized with 1% lidocaine. Using modified Seldinger technique, a 4 French sheath was introduced into the right femoral artery.  The patient became vagal, with bradycardia and BP of 60 systolic.  He was given atropine and fluids with resolution.   Standard Judkins catheters were used for coronary angiography and left ventriculography. Catheter exchanges were performed over a guidewire. There were no immediate procedural complications. The patient was transferred to the post catheterization recovery area for further monitoring.  I reviewed the pictures with the patient, and subsequently his wife as well.    Procedural Findings: Hemodynamics:  AO 96/55 (70) LV 96/9 No gradient on pullback.   Coronary angiography: Coronary dominance: right  On plain fluoroscopy, there is extensive calcification of the LAD in the mid vessel.   Left mainstem: large and no obstruction  Left anterior descending (LAD): heavily calcified as noted. There is 20-30% proximal irregularity and diffuse luminal irregularities throughout. The major diagonal has a segmental 50% stenosis, similar to the prior cath. Just after the diagonal is a 30-40% calcified eccentric plaque.  The distal LAD tapers.    Left circumflex (LCx): Provides a small ramus intermedius.  The large bifurcating OM1 has a 40% plaque, similar to prior studies.  The distal AV circ is small.   Right coronary artery (RCA): This vessel was previously stented.  The proximal vessel and stent are widely patent.  Just after the stent, there is edge narrowing of 30-40%.  The PDA and PLA are widely patent with minor irregularity.    Left ventriculography: Left ventricular  systolic function is normal, LVEF is estimated at 50-55%, there is no significant mitral regurgitation   Final Conclusions:   1.  No restenosis at stent site with 30-40% edge narrowing. 2.  Heavily calcified LAD with diffuse irregularities and mild LCA stenosis as noted. 3.  Preserved overall LV function  Recommendations:  1.. Continue medical therapy 2.  Consider routine GXT for exercise prescription 3.  Patient's wife discussed snoring and daytime sleepiness.  She thinks he has sleep apnea.  Evaluation would be worthwhile based on spousal history.   Shawnie Pons 03/26/2012, 9:42 AM

## 2012-03-26 NOTE — OR Nursing (Signed)
Tegaderm dressing applied, site level 0, bedrest begins at 0940 

## 2012-04-03 ENCOUNTER — Ambulatory Visit (INDEPENDENT_AMBULATORY_CARE_PROVIDER_SITE_OTHER): Payer: Medicare Other | Admitting: Physician Assistant

## 2012-04-03 ENCOUNTER — Encounter: Payer: Self-pay | Admitting: Physician Assistant

## 2012-04-03 VITALS — BP 130/62 | HR 69 | Ht 73.0 in | Wt 179.0 lb

## 2012-04-03 DIAGNOSIS — I2510 Atherosclerotic heart disease of native coronary artery without angina pectoris: Secondary | ICD-10-CM

## 2012-04-03 DIAGNOSIS — E785 Hyperlipidemia, unspecified: Secondary | ICD-10-CM

## 2012-04-03 NOTE — Patient Instructions (Addendum)
Continue all current medications. Your physician wants you to follow up in: 6 months.  You will receive a reminder letter in the mail one-two months in advance.  If you don't receive a letter, please call our office to schedule the follow up appointment   

## 2012-04-03 NOTE — Assessment & Plan Note (Addendum)
Results of recent catheterization were reviewed with the patient. No further cardiac workup indicated. Patient cleared to continue exercising on a regular basis. Of note, we reviewed the recommendation by [name] regarding possible OSA, based on patient's wife's report of snoring and daytime somnolence. Patient reports today, however, that his snoring has resolved, since employing nasal strips. Therefore, I advised him to continue monitoring this and, if he were to have recurrent symptoms, to confer with [name] regarding a formal evaluation for OSA. Will otherwise arrange a return visit here in 6 months, with [name].

## 2012-04-03 NOTE — Progress Notes (Signed)
Primary Cardiologist: [name], MD   HPI: Patient returns status post recent elective cardiac catheterization for evaluation of recent DOE and nocturnal CP, with known CAD.   - Cardiac catheterization, 11/7: Nonobstructive CAD with patent RCA stent; EF 50-55%  Clinically, he has not had any recurrent nocturnal CP. He continues to walk 2 miles a day, 5 times a week, with no associated chest discomfort.  No Known Allergies  Current Outpatient Prescriptions  Medication Sig Dispense Refill  . aspirin 81 MG tablet Take 81 mg by mouth daily.      . calcium carbonate (TUMS - DOSED IN MG ELEMENTAL CALCIUM) 500 MG chewable tablet Chew 1 tablet by mouth daily.      . nitroGLYCERIN (NITROSTAT) 0.4 MG SL tablet Place 1 tablet (0.4 mg total) under the tongue every 5 (five) minutes as needed.  25 tablet  3  . rosuvastatin (CRESTOR) 10 MG tablet Take 1 tablet (10 mg total) by mouth every evening.  30 tablet  6    Past Medical History  Diagnosis Date  . CAD (coronary artery disease)     NSTEMI/DES RCA, 05/11. EF 50%,inferoapical HK  . Dyslipidemia   . History of colon cancer     Past Surgical History  Procedure Date  . Colon surgery 1999  . Hernia repair     LEFT    History   Social History  . Marital Status: Married    Spouse Name: [name]    Number of Children: [name]  . Years of Education: [name]   Occupational History  . Not on file.   Social History Main Topics  . Smoking status: Former Smoker -- 1.0 packs/day for 10 years    Types: Cigarettes    Quit date: 05/20/1968  . Smokeless tobacco: Never Used  . Alcohol Use: Not on file  . Drug Use: Not on file  . Sexually Active: Not on file   Other Topics Concern  . Not on file   Social History Narrative  . No narrative on file    Family History  Problem Relation Age of Onset  . Coronary artery disease Father     had CABG in 1970  . Colon cancer Father     ROS: no nausea, vomiting; no fever, chills; no melena,  hematochezia; no claudication  PHYSICAL EXAM: BP 130/62  Pulse 69  Ht 6\' 1"  (1.854 m)  Wt 179 lb (81.194 kg)  BMI 23.62 kg/m2  SpO2 95% GENERAL: 71 year old male; NAD  HEENT: NCAT, PERRLA, EOMI; sclera clear; no xanthelasma  NECK: palpable bilateral carotid pulses, no bruits; no JVD; no TM  LUNGS: CTA bilaterally  CARDIAC: RRR (S1, S2); no significant murmurs; no rubs or gallops  ABDOMEN: soft, non-tender; intact BS  EXTREMETIES: intact distal pulses; no significant peripheral edema  SKIN: warm/dry; no obvious rash/lesions  MUSCULOSKELETAL: no joint deformity  NEURO: no focal deficit; NL affect    EKG:    ASSESSMENT & PLAN:  CAD Results of recent catheterization were reviewed with the patient. No further cardiac workup indicated. Patient cleared to continue exercising on a regular basis. Of note, we reviewed the recommendation by [name] regarding possible OSA, based on patient's wife's report of snoring and daytime somnolence. Patient reports today, however, that his snoring has resolved, since employing nasal strips. Therefore, I advised him to continue monitoring this and, if he were to have recurrent symptoms, to confer with [name] regarding a formal evaluation for OSA. Will otherwise arrange a return visit  here in 6 months, with [name].  DYSLIPIDEMIA Followup by primary M.D. Aggressive management recommended with target LDL 70 or less, if feasible.    Seth Higgins, PAC

## 2012-04-03 NOTE — Assessment & Plan Note (Signed)
Followup by primary M.D. Aggressive management recommended with target LDL 70 or less, if feasible.

## 2012-04-15 ENCOUNTER — Other Ambulatory Visit (INDEPENDENT_AMBULATORY_CARE_PROVIDER_SITE_OTHER): Payer: Self-pay | Admitting: *Deleted

## 2012-04-15 ENCOUNTER — Encounter (INDEPENDENT_AMBULATORY_CARE_PROVIDER_SITE_OTHER): Payer: Self-pay

## 2012-04-15 ENCOUNTER — Encounter (INDEPENDENT_AMBULATORY_CARE_PROVIDER_SITE_OTHER): Payer: Self-pay | Admitting: *Deleted

## 2012-04-15 ENCOUNTER — Telehealth (INDEPENDENT_AMBULATORY_CARE_PROVIDER_SITE_OTHER): Payer: Self-pay | Admitting: *Deleted

## 2012-04-15 DIAGNOSIS — Z85038 Personal history of other malignant neoplasm of large intestine: Secondary | ICD-10-CM

## 2012-04-15 DIAGNOSIS — Z1211 Encounter for screening for malignant neoplasm of colon: Secondary | ICD-10-CM

## 2012-04-15 MED ORDER — PEG-KCL-NACL-NASULF-NA ASC-C 100 G PO SOLR: 1.0000 | Freq: Once | ORAL | Status: DC

## 2012-04-15 NOTE — Telephone Encounter (Signed)
Patient needs movi prep 

## 2012-05-06 NOTE — H&P (Signed)
Primary Cardiologist: [name], MD (new)     HPI: Patient seen as an add-on for recent development of DOE, as well as 2 separate episodes of nausea/diaphoresis and new onset nocturnal CP.   Patient states that the episodes of nausea/diaphoresis, with no associated CP, are reminiscent of what he experienced when he presented with NSTEMI in May 2011. He was found to have severe RCA stenosis, successfully treated with a single DES. Residual nonobstructive CAD; EF 50%.   Over the last 3 weeks, he has noted development of exertional dyspnea and, in addition to the 2 episodes of nausea/diaphoresis, also has experienced 2 separate episodes of CP which awoke him during the middle the night. These were of mild intensity (3/10), but new and not similar to prior experiences with heartburn. These episodes were not associated with nausea/diaphoresis, nor with radiation to the jaw or upper extremities. They resolved spontaneously in less than 5 minutes. His last episode was approximately 2 weeks ago. Again, he denies any exertional CP, and did not experience any CP when he presented back in 2011.   12-lead EKG today, reviewed by me, indicates NSR 66 bpm; less than 1 mm J-point elevation in inferior leads with otherwise nonspecific ST changes in the high lateral leads.   No Known Allergies    Current Outpatient Prescriptions   Medication  Sig  Dispense  Refill   .  aspirin 81 MG tablet  Take 81 mg by mouth daily.         .  calcium carbonate (TUMS - DOSED IN MG ELEMENTAL CALCIUM) 500 MG chewable tablet  Chew 1 tablet by mouth daily.         .  rosuvastatin (CRESTOR) 10 MG tablet  Take 1 tablet (10 mg total) by mouth every evening.   30 tablet   6   .  nitroGLYCERIN (NITROSTAT) 0.4 MG SL tablet  Place 1 tablet (0.4 mg total) under the tongue every 5 (five) minutes as needed.   25 tablet   3   .  DISCONTD: nitroGLYCERIN (NITROSTAT) 0.4 MG SL tablet  Place 0.4 mg under the tongue every 5 (five) minutes as  needed.             Past Medical History   Diagnosis  Date   .  CAD (coronary artery disease)         NSTEMI/DES RCA, 05/11. EF 50%,inferoapical HK   .  Dyslipidemia     .  History of colon cancer         Past Surgical History   Procedure  Date   .  Colon surgery  1999   .  Hernia repair         LEFT       History       Social History   .  Marital Status:  Married       Spouse Name:  [name]       Number of Children:  [name]   .  Years of Education:  [name]       Occupational History   .  Not on file.       Social History Main Topics   .  Smoking status:  Former Smoker -- 1.0 packs/day for 10 years       Types:  Cigarettes       Quit date:  05/20/1968   .  Smokeless tobacco:  Never Used   .  Alcohol Use:  Not on file   .  Drug Use:  Not on file   .  Sexually Active:  Not on file       Other Topics  Concern   .  Not on file       Social History Narrative   .  No narrative on file    Social History Narrative   .  No narrative on file       Problem  Relation  Age of Onset   .  Coronary artery disease  Father         had CABG in 1970   .  Colon cancer  Father        ROS: no nausea, vomiting; no fever, chills; no melena, hematochezia; no claudication   PHYSICAL EXAM: BP 113/71  Pulse 66  Ht 6\' 1"  (1.854 m)  Wt 178 lb 12.8 oz (81.103 kg)  BMI 23.59 kg/m2  SpO2 98% GENERAL: 71 year old male; NAD HEENT: NCAT, PERRLA, EOMI; sclera clear; no xanthelasma NECK: palpable bilateral carotid pulses, no bruits; no JVD; no TM LUNGS: CTA bilaterally CARDIAC: RRR (S1, S2); no significant murmurs; no rubs or gallops ABDOMEN: soft, non-tender; intact BS EXTREMETIES: intact distal pulses; no significant peripheral edema SKIN: warm/dry; no obvious rash/lesions MUSCULOSKELETAL: no joint deformity NEURO: no focal deficit; NL affect     EKG: reviewed and available in Electronic Records     ASSESSMENT & PLAN:   CAD Recommendation is to proceed with an elective  cardiac catheterization in the [facility] lab, early next week. Patient presents with symptoms reminiscent of prior NSTEMI presentation back in 2011, at which time he was found to have severe mid RCA stenosis, successfully treated with drug-eluting stenting. In addition, he now presents with new onset nocturnal angina, which has resolved spontaneously. He has not had an episode in 2 weeks. Given his recent symptoms, I have recommended an aggressive evaluation, rather than a stress test, for definitive exclusion of significant CAD progression. The patient has concurred with this recommendation, and the risks/benefits of the procedure were reviewed. The plan was reviewed with and approved by [name], with whom patient will establish in the near future. Patient has been strongly advised to proceed directly to the ED in the intervening period, if he were to have recurrent symptoms unrelieved by 3 NTG tablets.       Gene Serpe, PAC  Patient seen for cardiac catheterization.  Patient was seen and evaluated by [name] and Shara Blazing and set up for the procedure.  See prelim note.  Risks discussed.

## 2012-06-04 ENCOUNTER — Telehealth (INDEPENDENT_AMBULATORY_CARE_PROVIDER_SITE_OTHER): Payer: Self-pay | Admitting: *Deleted

## 2012-06-04 NOTE — Telephone Encounter (Signed)
  Procedure: tcs  Reason/Indication:  Hx colon ca, famh x colon ca  Has patient had this procedure before?  yes  If so, when, by whom and where?  5 yrs ago (scanned)  Is there a family history of colon cancer?  yes  Who?  What age when diagnosed?  father  Is patient diabetic?   no      Does patient have prosthetic heart valve?  no  Do you have a pacemaker?  no  Has patient had joint replacement within last 12 months?  no  Is patient on Coumadin, Plavix and/or Aspirin? yes  Medications: asa 81 mg daily, crestor 10 mg daily  Allergies: nkda  Medication Adjustment: asa 2 days  Procedure date & time: 07/01/12 at 830

## 2012-06-04 NOTE — Telephone Encounter (Signed)
Agree 

## 2012-06-18 ENCOUNTER — Encounter (HOSPITAL_COMMUNITY): Payer: Self-pay | Admitting: Pharmacy Technician

## 2012-07-01 ENCOUNTER — Encounter (HOSPITAL_COMMUNITY): Payer: Self-pay | Admitting: *Deleted

## 2012-07-01 ENCOUNTER — Encounter (HOSPITAL_COMMUNITY)
Admission: RE | Disposition: A | Discharge status: Home / Self Care | Payer: Self-pay | Source: Ambulatory Visit | Attending: Internal Medicine

## 2012-07-01 ENCOUNTER — Ambulatory Visit (HOSPITAL_COMMUNITY)
Admission: RE | Admit: 2012-07-01 | Discharge: 2012-07-01 | Disposition: A | Discharge status: Home / Self Care | Payer: Medicare Other | Source: Ambulatory Visit | Attending: Internal Medicine | Admitting: Internal Medicine

## 2012-07-01 DIAGNOSIS — Z85038 Personal history of other malignant neoplasm of large intestine: Secondary | ICD-10-CM | POA: Insufficient documentation

## 2012-07-01 DIAGNOSIS — Z09 Encounter for follow-up examination after completed treatment for conditions other than malignant neoplasm: Secondary | ICD-10-CM | POA: Insufficient documentation

## 2012-07-01 DIAGNOSIS — D126 Benign neoplasm of colon, unspecified: Secondary | ICD-10-CM | POA: Insufficient documentation

## 2012-07-01 DIAGNOSIS — K644 Residual hemorrhoidal skin tags: Secondary | ICD-10-CM

## 2012-07-01 DIAGNOSIS — Z85048 Personal history of other malignant neoplasm of rectum, rectosigmoid junction, and anus: Secondary | ICD-10-CM

## 2012-07-01 HISTORY — PX: COLONOSCOPY: SHX5424

## 2012-07-01 HISTORY — DX: Malignant (primary) neoplasm, unspecified: C80.1

## 2012-07-01 SURGERY — COLONOSCOPY
Anesthesia: Moderate Sedation

## 2012-07-01 MED ORDER — MIDAZOLAM HCL 5 MG/5ML IJ SOLN
INTRAMUSCULAR | Status: DC | PRN
  Administered 2012-07-01 (×2): 2 mg via INTRAVENOUS

## 2012-07-01 MED ORDER — MEPERIDINE HCL 50 MG/ML IJ SOLN
INTRAMUSCULAR | Status: DC | PRN
  Administered 2012-07-01 (×2): 25 mg via INTRAVENOUS

## 2012-07-01 MED ORDER — MEPERIDINE HCL 50 MG/ML IJ SOLN
INTRAMUSCULAR | Status: AC
  Filled 2012-07-01: qty 1

## 2012-07-01 MED ORDER — STERILE WATER FOR IRRIGATION IR SOLN
Status: DC | PRN
  Administered 2012-07-01: 08:00:00

## 2012-07-01 MED ORDER — MIDAZOLAM HCL 5 MG/5ML IJ SOLN
INTRAMUSCULAR | Status: AC
  Filled 2012-07-01: qty 10

## 2012-07-01 MED ORDER — SODIUM CHLORIDE 0.45 % IV SOLN
INTRAVENOUS | Status: DC
  Administered 2012-07-01: 08:00:00 via INTRAVENOUS

## 2012-07-01 NOTE — H&P (Addendum)
Seth Higgins is an 72 y.o. male.   Chief Complaint: Patient is here for colonoscopy. HPI: Patient is 72 year old Caucasian male with history of colon carcinoma. Cecal resection in 1999. His last surveillance colonoscopy was in December 2008. At present he is free of GI symptoms. He denies abdominal pain change in his bowel habits or rectal bleeding. Him history significant for colon carcinoma in his father who is 31 at the time of diagnosis.  Past Medical History  Diagnosis Date  . CAD (coronary artery disease)     NSTEMI/DES RCA, 05/11. EF 50%,inferoapical HK  . Dyslipidemia   . History of colon cancer   . Cancer 1999    Colon Cancer    Past Surgical History  Procedure Laterality Date  . Colon surgery  1999  . Hernia repair      LEFT  . Cardiac catheterization  09/2009    with stent placement    Family History  Problem Relation Age of Onset  . Coronary artery disease Father     had CABG in 1970  . Colon cancer Father    Social History:  reports that he quit smoking about 44 years ago. His smoking use included Cigarettes. He has a 10 pack-year smoking history. He has never used smokeless tobacco. He reports that he does not drink alcohol. His drug history is not on file.  Allergies: No Known Allergies  Medications Prior to Admission  Medication Sig Dispense Refill  . aspirin 81 MG tablet Take 81 mg by mouth daily.      . calcium carbonate (TUMS - DOSED IN MG ELEMENTAL CALCIUM) 500 MG chewable tablet Chew 1 tablet by mouth daily.      . rosuvastatin (CRESTOR) 10 MG tablet Take 1 tablet (10 mg total) by mouth every evening.  30 tablet  6  . nitroGLYCERIN (NITROSTAT) 0.4 MG SL tablet Place 1 tablet (0.4 mg total) under the tongue every 5 (five) minutes as needed.  25 tablet  3    No results found for this or any previous visit (from the past 48 hour(s)). No results found.  ROS  Blood pressure 128/61, temperature 97.7 F (36.5 C), temperature source Oral, resp. rate 14,  height 6\' 1"  (1.854 m), weight 180 lb (81.647 kg). Physical Exam  Constitutional: He appears well-developed and well-nourished.  HENT:  Mouth/Throat: Oropharynx is clear and moist.  Eyes: Conjunctivae are normal. No scleral icterus.  Neck: No thyromegaly present.  Cardiovascular: Normal rate, regular rhythm and normal heart sounds.   Respiratory: Effort normal and breath sounds normal.  GI: Soft. He exhibits no distension and no mass. There is no tenderness.  Musculoskeletal: He exhibits no edema.  Lymphadenopathy:    He has no cervical adenopathy.  Neurological: He is alert.  Skin: Skin is warm and dry.     Assessment/Plan History of colon carcinoma. Surveillance colonoscopy.  Seth Higgins U 07/01/2012, 8:22 AM

## 2012-07-01 NOTE — Op Note (Signed)
COLONOSCOPY PROCEDURE REPORT  PATIENT:  Seth Higgins  MR#:  [id number] Birthdate:  Nov 25, 1940, 72 y.o., male Endoscopist:  Dr. Malissa Hippo, MD Referred By:  Dr. Fara Chute, MD Procedure Date: 07/01/2012  Procedure:   Colonoscopy with snare polypectomy.  Indications:  Patient is 72 year old Caucasian male with history of colon carcinoma who is here for surveillance colonoscopy. His last exam was in December 2008.  Informed Consent:  The procedure and risks were reviewed with the patient and informed consent was obtained.  Medications:  Demerol 50 mg IV Versed 4 mg IV  Description of procedure:  After a digital rectal exam was performed, that colonoscope was advanced from the anus through the rectum and colon to the area of the cecum, ileocecal valve and appendiceal orifice. The cecum was deeply intubated. These structures were well-seen and photographed for the record. From the level of the cecum and ileocecal valve, the scope was slowly and cautiously withdrawn. The mucosal surfaces were carefully surveyed utilizing scope tip to flexion to facilitate fold flattening as needed. The scope was pulled down into the rectum where a thorough exam including retroflexion was performed.  Findings:   Prep excellent. Threesmall polyps are coagulated using snare tip. 2 at hepatic flexure and one was at splenic flexure. Two polyps snared from proximal transverse colon. One was 5 mm and the other one was 6 mm. These polyps were submitted together. Colorectal anastomosis noted at 15 cm from anal margin and was wide open. Small hemorrhoids below the dentate line.  Therapeutic/Diagnostic Maneuvers Performed:  See above  Complications:  None  Cecal Withdrawal Time:  16 minutes  Impression:  Examination performed to cecum. Three small polyps are coagulated as above. Two polyps snared from proximal transverse colon and submitted together(5 and 6 mm each). Wide-open colorectal anastomosis at 15  cm from anal margin. Small external hemorrhoids.  Recommendations:  Standard instructions given. I will contact patient with biopsy results and further recommendations.  Amya Hlad U  07/01/2012 8:56 AM  CC: Dr. Estanislado Pandy, MD & Dr. Bonnetta Barry ref. provider found

## 2012-07-07 ENCOUNTER — Encounter (HOSPITAL_COMMUNITY): Payer: Self-pay | Admitting: Internal Medicine

## 2012-07-07 ENCOUNTER — Encounter (INDEPENDENT_AMBULATORY_CARE_PROVIDER_SITE_OTHER): Payer: Self-pay | Admitting: *Deleted

## 2012-10-28 ENCOUNTER — Telehealth: Payer: Self-pay

## 2012-10-28 NOTE — Telephone Encounter (Signed)
Opened in error

## 2013-04-22 ENCOUNTER — Encounter: Payer: Self-pay | Admitting: Cardiovascular Disease

## 2014-06-21 DIAGNOSIS — L570 Actinic keratosis: Secondary | ICD-10-CM | POA: Diagnosis not present

## 2014-06-21 DIAGNOSIS — Z85828 Personal history of other malignant neoplasm of skin: Secondary | ICD-10-CM | POA: Diagnosis not present

## 2014-07-19 DIAGNOSIS — E780 Pure hypercholesterolemia: Secondary | ICD-10-CM | POA: Diagnosis not present

## 2014-07-19 DIAGNOSIS — R739 Hyperglycemia, unspecified: Secondary | ICD-10-CM | POA: Diagnosis not present

## 2014-07-26 DIAGNOSIS — E782 Mixed hyperlipidemia: Secondary | ICD-10-CM | POA: Diagnosis not present

## 2014-07-26 DIAGNOSIS — E8881 Metabolic syndrome: Secondary | ICD-10-CM | POA: Diagnosis not present

## 2014-07-26 DIAGNOSIS — I2510 Atherosclerotic heart disease of native coronary artery without angina pectoris: Secondary | ICD-10-CM | POA: Diagnosis not present

## 2014-07-26 DIAGNOSIS — R739 Hyperglycemia, unspecified: Secondary | ICD-10-CM | POA: Diagnosis not present

## 2014-09-15 DIAGNOSIS — L570 Actinic keratosis: Secondary | ICD-10-CM | POA: Diagnosis not present

## 2014-09-26 ENCOUNTER — Other Ambulatory Visit (HOSPITAL_COMMUNITY): Payer: Self-pay | Admitting: Family Medicine

## 2014-09-26 ENCOUNTER — Ambulatory Visit (HOSPITAL_COMMUNITY)
Admission: RE | Admit: 2014-09-26 | Discharge: 2014-09-26 | Disposition: A | Discharge status: Home / Self Care | Payer: Commercial Managed Care - HMO | Source: Ambulatory Visit | Attending: Family Medicine | Admitting: Family Medicine

## 2014-09-26 DIAGNOSIS — R51 Headache: Secondary | ICD-10-CM | POA: Insufficient documentation

## 2014-09-26 DIAGNOSIS — R519 Headache, unspecified: Secondary | ICD-10-CM

## 2014-09-26 DIAGNOSIS — Z7982 Long term (current) use of aspirin: Secondary | ICD-10-CM | POA: Diagnosis not present

## 2014-09-26 DIAGNOSIS — B029 Zoster without complications: Secondary | ICD-10-CM | POA: Diagnosis not present

## 2014-10-13 DIAGNOSIS — B0229 Other postherpetic nervous system involvement: Secondary | ICD-10-CM | POA: Diagnosis not present

## 2014-11-09 DIAGNOSIS — B0229 Other postherpetic nervous system involvement: Secondary | ICD-10-CM | POA: Diagnosis not present

## 2014-11-09 DIAGNOSIS — R21 Rash and other nonspecific skin eruption: Secondary | ICD-10-CM | POA: Diagnosis not present

## 2015-01-03 DIAGNOSIS — L570 Actinic keratosis: Secondary | ICD-10-CM | POA: Diagnosis not present

## 2015-01-03 DIAGNOSIS — Z85828 Personal history of other malignant neoplasm of skin: Secondary | ICD-10-CM | POA: Diagnosis not present

## 2015-01-24 DIAGNOSIS — R739 Hyperglycemia, unspecified: Secondary | ICD-10-CM | POA: Diagnosis not present

## 2015-01-24 DIAGNOSIS — E780 Pure hypercholesterolemia: Secondary | ICD-10-CM | POA: Diagnosis not present

## 2015-01-31 DIAGNOSIS — I2510 Atherosclerotic heart disease of native coronary artery without angina pectoris: Secondary | ICD-10-CM | POA: Diagnosis not present

## 2015-01-31 DIAGNOSIS — Z23 Encounter for immunization: Secondary | ICD-10-CM | POA: Diagnosis not present

## 2015-01-31 DIAGNOSIS — E782 Mixed hyperlipidemia: Secondary | ICD-10-CM | POA: Diagnosis not present

## 2015-01-31 DIAGNOSIS — R739 Hyperglycemia, unspecified: Secondary | ICD-10-CM | POA: Diagnosis not present

## 2015-01-31 DIAGNOSIS — B0223 Postherpetic polyneuropathy: Secondary | ICD-10-CM | POA: Diagnosis not present

## 2015-01-31 DIAGNOSIS — Z1389 Encounter for screening for other disorder: Secondary | ICD-10-CM | POA: Diagnosis not present

## 2015-04-24 DIAGNOSIS — B0223 Postherpetic polyneuropathy: Secondary | ICD-10-CM | POA: Diagnosis not present

## 2015-06-12 DIAGNOSIS — Z85828 Personal history of other malignant neoplasm of skin: Secondary | ICD-10-CM | POA: Diagnosis not present

## 2015-06-12 DIAGNOSIS — L570 Actinic keratosis: Secondary | ICD-10-CM | POA: Diagnosis not present

## 2015-07-21 DIAGNOSIS — E782 Mixed hyperlipidemia: Secondary | ICD-10-CM | POA: Diagnosis not present

## 2015-07-21 DIAGNOSIS — R739 Hyperglycemia, unspecified: Secondary | ICD-10-CM | POA: Diagnosis not present

## 2015-07-25 DIAGNOSIS — R739 Hyperglycemia, unspecified: Secondary | ICD-10-CM | POA: Diagnosis not present

## 2015-07-25 DIAGNOSIS — I2510 Atherosclerotic heart disease of native coronary artery without angina pectoris: Secondary | ICD-10-CM | POA: Diagnosis not present

## 2015-07-25 DIAGNOSIS — B0223 Postherpetic polyneuropathy: Secondary | ICD-10-CM | POA: Diagnosis not present

## 2015-07-25 DIAGNOSIS — E782 Mixed hyperlipidemia: Secondary | ICD-10-CM | POA: Diagnosis not present

## 2015-12-11 DIAGNOSIS — L570 Actinic keratosis: Secondary | ICD-10-CM | POA: Diagnosis not present

## 2016-01-19 DIAGNOSIS — E7800 Pure hypercholesterolemia, unspecified: Secondary | ICD-10-CM | POA: Diagnosis not present

## 2016-01-19 DIAGNOSIS — E782 Mixed hyperlipidemia: Secondary | ICD-10-CM | POA: Diagnosis not present

## 2016-01-24 DIAGNOSIS — Z23 Encounter for immunization: Secondary | ICD-10-CM | POA: Diagnosis not present

## 2016-01-24 DIAGNOSIS — Z6824 Body mass index (BMI) 24.0-24.9, adult: Secondary | ICD-10-CM | POA: Diagnosis not present

## 2016-01-24 DIAGNOSIS — R739 Hyperglycemia, unspecified: Secondary | ICD-10-CM | POA: Diagnosis not present

## 2016-01-24 DIAGNOSIS — B0223 Postherpetic polyneuropathy: Secondary | ICD-10-CM | POA: Diagnosis not present

## 2016-01-24 DIAGNOSIS — E782 Mixed hyperlipidemia: Secondary | ICD-10-CM | POA: Diagnosis not present

## 2016-01-24 DIAGNOSIS — I2510 Atherosclerotic heart disease of native coronary artery without angina pectoris: Secondary | ICD-10-CM | POA: Diagnosis not present

## 2016-04-02 DIAGNOSIS — L570 Actinic keratosis: Secondary | ICD-10-CM | POA: Diagnosis not present
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
## 2016-07-31 DIAGNOSIS — R739 Hyperglycemia, unspecified: Secondary | ICD-10-CM | POA: Diagnosis not present

## 2016-07-31 DIAGNOSIS — E8881 Metabolic syndrome: Secondary | ICD-10-CM | POA: Diagnosis not present

## 2016-07-31 DIAGNOSIS — E782 Mixed hyperlipidemia: Secondary | ICD-10-CM | POA: Diagnosis not present

## 2016-08-05 DIAGNOSIS — E782 Mixed hyperlipidemia: Secondary | ICD-10-CM | POA: Diagnosis not present

## 2016-08-05 DIAGNOSIS — Z6824 Body mass index (BMI) 24.0-24.9, adult: Secondary | ICD-10-CM | POA: Diagnosis not present

## 2016-08-05 DIAGNOSIS — B0223 Postherpetic polyneuropathy: Secondary | ICD-10-CM | POA: Diagnosis not present

## 2016-08-05 DIAGNOSIS — Z1389 Encounter for screening for other disorder: Secondary | ICD-10-CM | POA: Diagnosis not present

## 2016-08-05 DIAGNOSIS — I2510 Atherosclerotic heart disease of native coronary artery without angina pectoris: Secondary | ICD-10-CM | POA: Diagnosis not present

## 2016-08-05 DIAGNOSIS — R739 Hyperglycemia, unspecified: Secondary | ICD-10-CM | POA: Diagnosis not present

## 2016-10-16 ENCOUNTER — Ambulatory Visit: Payer: Medicare HMO | Admitting: Diagnostic Neuroimaging

## 2016-10-17 ENCOUNTER — Encounter: Payer: Self-pay | Admitting: Neurology

## 2016-10-17 ENCOUNTER — Ambulatory Visit (INDEPENDENT_AMBULATORY_CARE_PROVIDER_SITE_OTHER): Payer: Medicare HMO | Admitting: Neurology

## 2016-10-17 DIAGNOSIS — B0229 Other postherpetic nervous system involvement: Secondary | ICD-10-CM | POA: Diagnosis not present

## 2016-10-17 HISTORY — DX: Other postherpetic nervous system involvement: B02.29

## 2016-10-17 MED ORDER — AMITRIPTYLINE HCL 10 MG PO TABS: 10.0000 mg | ORAL_TABLET | Freq: Every day | ORAL | 3 refills | Status: DC

## 2016-10-17 MED ORDER — LIDOCAINE 4 % EX LOTN: 1.0000 g | TOPICAL_LOTION | Freq: Three times a day (TID) | CUTANEOUS | 5 refills | Status: AC | PRN

## 2016-10-17 NOTE — Progress Notes (Signed)
Reason for visit: Postherpetic neuralgia  Referring physician: Dr. Virgel Bouquet is a 76 y.o. male  History of present illness:  Seth Higgins is a 76 year old white male with a history of a shingles outbreak that occurred 3 years ago affecting the left C2/C3 level. The patient had a rash on the back of the head and on the left shoulder and neck. The patient has had significant discomfort since that time. He has a burning sensation and some numbness in the left neck, the pain is worse when he turns his head to the left. The patient has an itching sensation, there is a buildup of discomfort, he will scratch to improve the discomfort. He has found that Aspercreme with lidocaine helps. He is on amitriptyline and gabapentin in low dose. Initiation of these medications resulted in drowsiness initially but he is now tolerating the drugs well. He is sleeping fairly well at night. The patient notes that when he touches the left ear canal he has a sensation around the left eye. The patient denies any numbness or weakness of the extremities, he denies balance issues or difficulty controlling the bowels or the bladder. He is sent to this office for an evaluation.  Past Medical History:  Diagnosis Date  . CAD (coronary artery disease)    NSTEMI/DES RCA, 05/11. EF 50%,inferoapical HK  . Cancer [facility]) 1999   Colon Cancer  . Dyslipidemia   . History of colon cancer    pre-cancer    Past Surgical History:  Procedure Laterality Date  . CARDIAC CATHETERIZATION  09/2009   with stent placement  . COLON SURGERY  1999  . COLONOSCOPY [name] 07/01/2012   Procedure: COLONOSCOPY;  Surgeon: Rogene Houston, MD;  [facility];  Service: Endoscopy;  Laterality: [name];  830  . HERNIA REPAIR     LEFT    Family History  Problem Relation Age of Onset  . Coronary artery disease Father        had CABG in 1970  . Colon cancer Father     Social history:  reports that he quit smoking about 48 years  ago. His smoking use included Cigarettes. He has a 10.00 pack-year smoking history. He has never used smokeless tobacco. He reports that he does not drink alcohol or use drugs.  Medications:  Prior to Admission medications   Medication Sig Start Date End Date Taking? Authorizing Provider  amitriptyline (ELAVIL) 10 MG tablet Take 10 mg by mouth daily. 09/17/16  Yes [provider]  aspirin 81 MG tablet Take 81 mg by mouth daily.   Yes [provider]  calcium carbonate (TUMS - DOSED IN MG ELEMENTAL CALCIUM) 500 MG chewable tablet Chew 1 tablet by mouth daily.   Yes [provider]  gabapentin (NEURONTIN) 300 MG capsule Take 300 mg by mouth 2 (two) times daily. 10/14/16  Yes [provider]  Multiple Vitamins-Minerals (MULTIVITAMIN ADULTS PO) Take 1 tablet by mouth daily.   Yes [provider]  nitroGLYCERIN (NITROSTAT) 0.4 MG SL tablet Place 1 tablet (0.4 mg total) under the tongue every 5 (five) minutes as needed. 03/20/12  Yes Serpe, Burna Forts, PA-C  rosuvastatin (CRESTOR) 10 MG tablet Take 1 tablet (10 mg total) by mouth every evening. 10/31/11  Yes de Stanford Scotland, MD     No Known Allergies  ROS:  Out of a complete 14 system review of symptoms, the patient complains only of the following symptoms, and all other  reviewed systems are negative.  Snoring Left neck discomfort  Blood pressure 128/66, pulse (!) 57, height 6\' 1"  (1.854 m), weight 184 lb (83.5 kg).  Physical Exam  General: The patient is alert and cooperative at the time of the examination.  Eyes: Pupils are equal, round, and reactive to light. Discs are flat bilaterally.  Neck: The neck is supple, no carotid bruits are noted.  Respiratory: The respiratory examination is clear.  Cardiovascular: The cardiovascular examination reveals a regular rate and rhythm, no obvious murmurs or rubs are noted.  Neuromuscular: The patient has full range of motion movement of the cervical spine  with turning the head to the right, lacks about 15-20 when turning to the left.  Skin: Extremities are without significant edema.  Neurologic Exam  Mental status: The patient is alert and oriented x 3 at the time of the examination. The patient has apparent normal recent and remote memory, with an apparently normal attention span and concentration ability.  Cranial nerves: Facial symmetry is present. There is good sensation of the face to pinprick and soft touch bilaterally. There is some slight decrease in pinprick sensation on the left posterior neck as compared the right. The strength of the facial muscles and the muscles to head turning and shoulder shrug are normal bilaterally. Speech is well enunciated, no aphasia or dysarthria is noted. Extraocular movements are full. Visual fields are full. The tongue is midline, and the patient has symmetric elevation of the soft palate. No obvious hearing deficits are noted.  Motor: The motor testing reveals 5 over 5 strength of all 4 extremities. Good symmetric motor tone is noted throughout.  Sensory: Sensory testing is intact to pinprick, soft touch, vibration sensation, and position sense on all 4 extremities. No evidence of extinction is noted.  Coordination: Cerebellar testing reveals good finger-nose-finger and heel-to-shin bilaterally.  Gait and station: Gait is normal. Tandem gait is normal. Romberg is negative. No drift is seen.  Reflexes: Deep tendon reflexes are symmetric and normal bilaterally. Toes are downgoing bilaterally.   Assessment/Plan:  1. Postherpetic neuralgia, left C2/C3 level  The patient is having ongoing discomfort that is likely to be permanent at this point as he has had discomfort for 3 years. The patient will be increased on amitriptyline taking 20 mg at night, he will call for dose adjustments. The patient is also on low-dose gabapentin, this can be increased in the future if needed as well. The patient will be  given a prescription for topical lidocaine, a Lidoderm patch could be used in the future. The patient will follow-up in 3 months.  Seth Alexanders MD 10/17/2016 11:29 AM  [facility] [address] [place], [place] [address]  Phone [phone] Fax [phone]

## 2016-10-21 ENCOUNTER — Telehealth: Payer: Self-pay | Admitting: Neurology

## 2016-10-21 MED ORDER — AMITRIPTYLINE HCL 10 MG PO TABS: 20.0000 mg | ORAL_TABLET | Freq: Every day | ORAL | 3 refills | Status: DC

## 2016-10-21 NOTE — Addendum Note (Signed)
Addended by: Kathrynn Ducking on: 10/21/2016 10:39 AM   Modules accepted: Orders

## 2016-10-21 NOTE — Telephone Encounter (Signed)
The patient apparently was given Aspercreme with lidocaine, but was ordered was pure lidocaine 4% lotion.  The amitriptyline is to be increased to 20 mg daily, I have sent in a prescription for this.

## 2016-10-21 NOTE — Telephone Encounter (Signed)
Pt wife calling to inform that the medication that Dr Jannifer Franklin said he was going to call in was not what was ready when pt and his wife went to [place]. The Lidocaine  Was supposed to be prescription strength and the amitriptyline (ELAVIL) 10 MG tablet Was supposed to be increased, it was not.  Please call pt wife

## 2017-01-10 DIAGNOSIS — Z0100 Encounter for examination of eyes and vision without abnormal findings: Secondary | ICD-10-CM | POA: Diagnosis not present

## 2017-01-10 DIAGNOSIS — H5200 Hypermetropia, unspecified eye: Secondary | ICD-10-CM | POA: Diagnosis not present

## 2017-01-10 DIAGNOSIS — H2510 Age-related nuclear cataract, unspecified eye: Secondary | ICD-10-CM | POA: Diagnosis not present

## 2017-01-30 ENCOUNTER — Encounter: Payer: Self-pay | Admitting: Adult Health

## 2017-01-30 ENCOUNTER — Ambulatory Visit (INDEPENDENT_AMBULATORY_CARE_PROVIDER_SITE_OTHER): Payer: Medicare HMO | Admitting: Adult Health

## 2017-01-30 VITALS — BP 110/62 | HR 68 | Wt 183.2 lb

## 2017-01-30 DIAGNOSIS — B0229 Other postherpetic nervous system involvement: Secondary | ICD-10-CM

## 2017-01-30 MED ORDER — GABAPENTIN 400 MG PO CAPS: 400.0000 mg | ORAL_CAPSULE | Freq: Two times a day (BID) | ORAL | 11 refills | Status: DC

## 2017-01-30 NOTE — Progress Notes (Signed)
PATIENT: Seth Higgins DOB: Jun 22, 1940  REASON FOR VISIT: follow up HISTORY FROM: patient  HISTORY OF PRESENT ILLNESS: Today 01/30/17 Seth Higgins is a 75 year old male with a history of shingles and now postherpetic neuralgia. He returns today for follow-up. At the last visit amitriptyline  was increased to 20 mg at bedtime. He remains on gabapentin 300 mg twice a day and lidocaine cream was called in for the patient. He reports overall he can't tell much difference in his discomfort. He states that he has some good days and some bad days. He finds that if he is very active and sweats the discomfort worsens. He states when he mentions discomfort he experiences more itching then pain. He states that the itching will be so intense that it will causel bleeding. Patient states he is currently taking amitriptyline twice a day instead of just at bedtime. He is sleepy throughout the day. He returns today for an evaluation.   HISTORY 10/17/16: Seth Higgins is a 76 year old white male with a history of a shingles outbreak that occurred 3 years ago affecting the left C2/C3 level. The patient had a rash on the back of the head and on the left shoulder and neck. The patient has had significant discomfort since that time. He has a burning sensation and some numbness in the left neck, the pain is worse when he turns his head to the left. The patient has an itching sensation, there is a buildup of discomfort, he will scratch to improve the discomfort. He has found that Aspercreme with lidocaine helps. He is on amitriptyline and gabapentin in low dose. Initiation of these medications resulted in drowsiness initially but he is now tolerating the drugs well. He is sleeping fairly well at night. The patient notes that when he touches the left ear canal he has a sensation around the left eye. The patient denies any numbness or weakness of the extremities, he denies balance issues or difficulty controlling the bowels or the  bladder. He is sent to this office for an evaluation.   REVIEW OF SYSTEMS: Out of a complete 14 system review of symptoms, the patient complains only of the following symptoms, and all other reviewed systems are negative.  Snoring  ALLERGIES: No Known Allergies  HOME MEDICATIONS: Outpatient Medications Prior to Visit  Medication Sig Dispense Refill  . amitriptyline (ELAVIL) 10 MG tablet Take 2 tablets (20 mg total) by mouth at bedtime. 60 tablet 3  . aspirin 81 MG tablet Take 81 mg by mouth daily.    . calcium carbonate (TUMS - DOSED IN MG ELEMENTAL CALCIUM) 500 MG chewable tablet Chew 1 tablet by mouth daily.    Marland Kitchen gabapentin (NEURONTIN) 300 MG capsule Take 300 mg by mouth 2 (two) times daily.    . Lidocaine 4 % LOTN Apply 1 g topically 3 (three) times daily as needed. 1 Bottle 5  . Multiple Vitamins-Minerals (MULTIVITAMIN ADULTS PO) Take 1 tablet by mouth daily.    . nitroGLYCERIN (NITROSTAT) 0.4 MG SL tablet Place 1 tablet (0.4 mg total) under the tongue every 5 (five) minutes as needed. 25 tablet 3  . rosuvastatin (CRESTOR) 10 MG tablet Take 1 tablet (10 mg total) by mouth every evening. 30 tablet 6   No facility-administered medications prior to visit.     PAST MEDICAL HISTORY: Past Medical History:  Diagnosis Date  . CAD (coronary artery disease)    NSTEMI/DES RCA, 05/11. EF 50%,inferoapical HK  . Cancer [facility]) 1999  Colon Cancer  . Dyslipidemia   . History of colon cancer    pre-cancer  . Post herpetic neuralgia 10/17/2016   Left C2-3    PAST SURGICAL HISTORY: Past Surgical History:  Procedure Laterality Date  . CARDIAC CATHETERIZATION  09/2009   with stent placement  . COLON SURGERY  1999  . COLONOSCOPY [name] 07/01/2012   Procedure: COLONOSCOPY;  Surgeon: Rogene Houston, MD;  Location[facility];  Service: Endoscopy;  Laterality: [name];  830  . HERNIA REPAIR     LEFT    FAMILY HISTORY: Family History  Problem Relation Age of Onset  . Coronary artery disease  Father        had CABG in 1970  . Colon cancer Father     SOCIAL HISTORY: Social History   Social History  . Marital status: Married    Spouse name: Vaughan Basta  . Number of children: 0  . Years of education: 12   Occupational History  . Not on file.   Social History Main Topics  . Smoking status: Former Smoker    Packs/day: 1.00    Years: 10.00    Types: Cigarettes    Quit date: 05/20/1968  . Smokeless tobacco: Never Used  . Alcohol use No  . Drug use: No  . Sexual activity: Not on file   Other Topics Concern  . Not on file   Social History Narrative   Lives with wife   Caffeine use: Very little per day (1 cup per day)   Right handed      PHYSICAL EXAM  Vitals:   01/30/17 1248  BP: 110/62  Pulse: 68  Weight: 183 lb 3.2 oz (83.1 kg)   Body mass index is 24.17 kg/m.  Generalized: Well developed, in no acute distress   Neurological examination  Mentation: Alert oriented to time, place, history taking. Follows all commands speech and language fluent Cranial nerve II-XII: Pupils were equal round reactive to light. Extraocular movements were full, visual field were full on confrontational test. Facial sensation and strength were normal. Uvula tongue midline. Head turning and shoulder shrug  were normal and symmetric. Motor: The motor testing reveals 5 over 5 strength of all 4 extremities. Good symmetric motor tone is noted throughout.  Sensory: Sensory testing is intact to soft touch on all 4 extremities. No evidence of extinction is noted.  Coordination: Cerebellar testing reveals good finger-nose-finger and heel-to-shin bilaterally.  Gait and station: Gait is normal.  Reflexes: Deep tendon reflexes are symmetric and normal bilaterally.   DIAGNOSTIC DATA (LABS, IMAGING, TESTING) - I reviewed patient records, labs, notes, testing and imaging myself where available.  Lab Results  Component Value Date   WBC 8.1 09/26/2009   HGB 15.2 09/26/2009   HCT 43.3  09/26/2009   MCV 94.3 09/26/2009   PLT 212 09/26/2009      Component Value Date/Time   NA 140 09/26/2009 0417   K 4.0 09/26/2009 0417   CL 109 09/26/2009 0417   CO2 24 09/26/2009 0417   GLUCOSE 101 (H) 09/26/2009 0417   BUN 10 09/26/2009 0417   CREATININE 0.98 09/26/2009 0417   CALCIUM 8.7 09/26/2009 0417   GFRNONAA >60 09/26/2009 0417   GFRAA  09/26/2009 0417    >60        The eGFR has been calculated using the MDRD equation. This calculation has not been validated in all clinical situations. eGFR's persistently <60 mL/min signify possible Chronic Kidney Disease.    Lab Results  Component Value Date   HGBA1C  09/25/2009    5.2 (NOTE)                                                                       According to the ADA Clinical Practice Recommendations for 2011, when HbA1c is used as a screening test:   >=6.5%   Diagnostic of Diabetes Mellitus           (if abnormal result  is confirmed)  5.7-6.4%   Increased risk of developing Diabetes Mellitus  References:Diagnosis and Classification of Diabetes Mellitus,Diabetes Care,2011,34(Suppl 1):S62-S69 and Standards of Medical Care in         Diabetes - 2011,Diabetes PJKD,3267,12  (Suppl 1):S11-S61.      ASSESSMENT AND PLAN 76 y.o. year old male  has a past medical history of CAD (coronary artery disease); Cancer ([place]) (1999); Dyslipidemia; History of colon cancer; and Post herpetic neuralgia (10/17/2016). here with:  1. Postherpetic neuralgia  The patient continues to have some discomfort primarily itching. I will increase gabapentin to 400 mg twice a day. He is advised that he should take amitriptyline 20 mg at bedtime. Hopefully eliminating the morning dose will improve his sleepiness. He can continue using the lidocaine cream as needed. He is advised that if his symptoms worsen or he develops new symptoms he should let us know. He will follow-up in 6 months or sooner if needed.     Ward Givens, MSN, NP-C 01/30/2017,  12:55 PM [facility] [address], [place], [place] [address] [phone]

## 2017-01-30 NOTE — Progress Notes (Signed)
I have read the note, and I agree with the clinical assessment and plan.  Amaura Authier KEITH   

## 2017-01-30 NOTE — Patient Instructions (Signed)
Your Plan:  Increase Gabapentin to 400 mg twice a day Take Amitriptyline 20 mg at bedtime  Continue using lidocaine cream If your symptoms worsen or you develop new symptoms please let us know.   Thank you for coming to see [place] at [facility]. I hope we have been able to provide you high quality care today.  You may receive a patient satisfaction survey over the next few weeks. We would appreciate your feedback and comments so that we may continue to improve ourselves and the health of our patients.

## 2017-02-03 DIAGNOSIS — E782 Mixed hyperlipidemia: Secondary | ICD-10-CM | POA: Diagnosis not present

## 2017-02-03 DIAGNOSIS — E7800 Pure hypercholesterolemia, unspecified: Secondary | ICD-10-CM | POA: Diagnosis not present

## 2017-02-03 DIAGNOSIS — I2510 Atherosclerotic heart disease of native coronary artery without angina pectoris: Secondary | ICD-10-CM | POA: Diagnosis not present

## 2017-02-06 DIAGNOSIS — Z6824 Body mass index (BMI) 24.0-24.9, adult: Secondary | ICD-10-CM | POA: Diagnosis not present

## 2017-02-06 DIAGNOSIS — Z23 Encounter for immunization: Secondary | ICD-10-CM | POA: Diagnosis not present

## 2017-02-06 DIAGNOSIS — B0223 Postherpetic polyneuropathy: Secondary | ICD-10-CM | POA: Diagnosis not present

## 2017-02-06 DIAGNOSIS — R739 Hyperglycemia, unspecified: Secondary | ICD-10-CM | POA: Diagnosis not present

## 2017-02-06 DIAGNOSIS — E782 Mixed hyperlipidemia: Secondary | ICD-10-CM | POA: Diagnosis not present

## 2017-02-06 DIAGNOSIS — I2510 Atherosclerotic heart disease of native coronary artery without angina pectoris: Secondary | ICD-10-CM | POA: Diagnosis not present

## 2017-02-13 DIAGNOSIS — R69 Illness, unspecified: Secondary | ICD-10-CM | POA: Diagnosis not present

## 2017-03-29 ENCOUNTER — Other Ambulatory Visit: Payer: Self-pay | Admitting: Neurology

## 2017-04-01 DIAGNOSIS — L570 Actinic keratosis: Secondary | ICD-10-CM | POA: Diagnosis not present

## 2017-06-12 DIAGNOSIS — Z0001 Encounter for general adult medical examination with abnormal findings: Secondary | ICD-10-CM | POA: Diagnosis not present

## 2017-06-12 DIAGNOSIS — R7301 Impaired fasting glucose: Secondary | ICD-10-CM | POA: Diagnosis not present

## 2017-06-12 DIAGNOSIS — E782 Mixed hyperlipidemia: Secondary | ICD-10-CM | POA: Diagnosis not present

## 2017-06-12 DIAGNOSIS — B0229 Other postherpetic nervous system involvement: Secondary | ICD-10-CM | POA: Diagnosis not present

## 2017-06-12 DIAGNOSIS — Z6824 Body mass index (BMI) 24.0-24.9, adult: Secondary | ICD-10-CM | POA: Diagnosis not present

## 2017-06-12 DIAGNOSIS — I2510 Atherosclerotic heart disease of native coronary artery without angina pectoris: Secondary | ICD-10-CM | POA: Diagnosis not present

## 2017-07-15 ENCOUNTER — Encounter (INDEPENDENT_AMBULATORY_CARE_PROVIDER_SITE_OTHER): Payer: Self-pay | Admitting: *Deleted

## 2017-07-31 ENCOUNTER — Encounter: Payer: Self-pay | Admitting: Adult Health

## 2017-07-31 ENCOUNTER — Ambulatory Visit: Payer: Medicare HMO | Admitting: Adult Health

## 2017-07-31 VITALS — BP 132/65 | HR 70 | Ht 73.0 in | Wt 188.2 lb

## 2017-07-31 DIAGNOSIS — B0229 Other postherpetic nervous system involvement: Secondary | ICD-10-CM

## 2017-07-31 MED ORDER — AMITRIPTYLINE HCL 10 MG PO TABS: 20.0000 mg | ORAL_TABLET | Freq: Every day | ORAL | 3 refills | Status: DC

## 2017-07-31 NOTE — Progress Notes (Signed)
PATIENT: Seth Higgins DOB: 1940-12-18  REASON FOR VISIT: follow up HISTORY FROM: patient  HISTORY OF PRESENT ILLNESS: Today 07/31/17 Seth Higgins is a 77 year old male with a history of shingles and postherpetic neuralgia.  He returns today for follow-up.  He remains on amitriptyline 20 mg at bedtime.  At the last visit we increase gabapentin to 400 twice a day.  He reports that this has been beneficial.  He states that his pain is under much better control.  He reports that every now and then he will have a sharp shooting pain.  Location of the pain is  in the back of the neck at the hairline on the left side.  Denies any trouble sleeping.  Denies rash or blisters.  Reports that he does have some hoarseness that he thought may be related to gabapentin.  He also reports some daytime sleepiness but feels that due to the gabapentin.  Reports that this is tolerable at this time.  His wife reports that he does snore and she has witnessed apneic events.  He returns today for evaluation.  HISTORY09/13/18 Seth Higgins is a 77 year old male with a history of shingles and now postherpetic neuralgia. He returns today for follow-up. At the last visit amitriptyline  was increased to 20 mg at bedtime. He remains on gabapentin 300 mg twice a day and lidocaine cream was called in for the patient. He reports overall he can't tell much difference in his discomfort. He states that he has some good days and some bad days. He finds that if he is very active and sweats the discomfort worsens. He states when he mentions discomfort he experiences more itching then pain. He states that the itching will be so intense that it will causel bleeding. Patient states he is currently taking amitriptyline twice a day instead of just at bedtime. He is sleepy throughout the day. He returns today for an evaluation.   REVIEW OF SYSTEMS: Out of a complete 14 system review of symptoms, the patient complains only of the following symptoms,  and all other reviewed systems are negative.  See HPI  ALLERGIES: No Known Allergies  HOME MEDICATIONS: Outpatient Medications Prior to Visit  Medication Sig Dispense Refill  . amitriptyline (ELAVIL) 10 MG tablet TAKE 2 TABLETS BY MOUTH AT BEDTIME 180 tablet 1  . aspirin 81 MG tablet Take 81 mg by mouth daily.    Marland Kitchen gabapentin (NEURONTIN) 400 MG capsule Take 1 capsule (400 mg total) by mouth 2 (two) times daily. 60 capsule 11  . Lidocaine 4 % LOTN Apply 1 g topically 3 (three) times daily as needed. 1 Bottle 5  . Multiple Vitamins-Minerals (MULTIVITAMIN ADULTS PO) Take 1 tablet by mouth daily.    . nitroGLYCERIN (NITROSTAT) 0.4 MG SL tablet Place 1 tablet (0.4 mg total) under the tongue every 5 (five) minutes as needed. 25 tablet 3  . rosuvastatin (CRESTOR) 10 MG tablet Take 1 tablet (10 mg total) by mouth every evening. 30 tablet 6  . calcium carbonate (TUMS - DOSED IN MG ELEMENTAL CALCIUM) 500 MG chewable tablet Chew 1 tablet by mouth daily.     No facility-administered medications prior to visit.     PAST MEDICAL HISTORY: Past Medical History:  Diagnosis Date  . CAD (coronary artery disease)    NSTEMI/DES RCA, 05/11. EF 50%,inferoapical HK  . Cancer [facility]) 1999   Colon Cancer  . Dyslipidemia   . History of colon cancer    pre-cancer  . Post  herpetic neuralgia 10/17/2016   Left C2-3    PAST SURGICAL HISTORY: Past Surgical History:  Procedure Laterality Date  . CARDIAC CATHETERIZATION  09/2009   with stent placement  . COLON SURGERY  1999  . COLONOSCOPY [name] 07/01/2012   Procedure: COLONOSCOPY;  Surgeon: Rogene Houston, MD;  [facility];  Service: Endoscopy;  Laterality: [name];  830  . HERNIA REPAIR     LEFT    FAMILY HISTORY: Family History  Problem Relation Age of Onset  . Coronary artery disease Father        had CABG in 1970  . Colon cancer Father     SOCIAL HISTORY: Social History   Socioeconomic History  . Marital status: Married    Spouse  name: Vaughan Basta  . Number of children: 0  . Years of education: 64  . Highest education level: Not on file  Social Needs  . Financial resource strain: Not on file  . Food insecurity - worry: Not on file  . Food insecurity - inability: Not on file  . Transportation needs - medical: Not on file  . Transportation needs - non-medical: Not on file  Occupational History  . Not on file  Tobacco Use  . Smoking status: Former Smoker    Packs/day: 1.00    Years: 10.00    Pack years: 10.00    Types: Cigarettes    Last attempt to quit: 05/20/1968    Years since quitting: 49.2  . Smokeless tobacco: Never Used  Substance and Sexual Activity  . Alcohol use: No  . Drug use: No  . Sexual activity: Not on file  Other Topics Concern  . Not on file  Social History Narrative   Lives with wife   Caffeine use: Very little per day (1 cup per day)   Right handed      PHYSICAL EXAM  Vitals:   07/31/17 1113  BP: 132/65  Pulse: 70  Weight: 188 lb 3.2 oz (85.4 kg)  Height: 6' 1"  (1.854 m)   Body mass index is 24.83 kg/m.  Generalized: Well developed, in no acute distress   Neurological examination  Mentation: Alert oriented to time, place, history taking. Follows all commands speech and language fluent Cranial nerve II-XII: Pupils were equal round reactive to light. Extraocular movements were full, visual field were full on confrontational test. Facial sensation and strength were normal. Uvula tongue midline. Head turning and shoulder shrug  were normal and symmetric. Motor: The motor testing reveals 5 over 5 strength of all 4 extremities. Good symmetric motor tone is noted throughout.  Sensory: Sensory testing is intact to soft touch on all 4 extremities. No evidence of extinction is noted.  Coordination: Cerebellar testing reveals good finger-nose-finger and heel-to-shin bilaterally.  Gait and station: Gait is normal. Tandem gait is normal. Romberg is negative. No drift is seen.  Reflexes:  Deep tendon reflexes are symmetric and normal bilaterally.   DIAGNOSTIC DATA (LABS, IMAGING, TESTING) - I reviewed patient records, labs, notes, testing and imaging myself where available.  Lab Results  Component Value Date   WBC 8.1 09/26/2009   HGB 15.2 09/26/2009   HCT 43.3 09/26/2009   MCV 94.3 09/26/2009   PLT 212 09/26/2009      Component Value Date/Time   NA 140 09/26/2009 0417   K 4.0 09/26/2009 0417   CL 109 09/26/2009 0417   CO2 24 09/26/2009 0417   GLUCOSE 101 (H) 09/26/2009 0417   BUN 10 09/26/2009 0417  CREATININE 0.98 09/26/2009 0417   CALCIUM 8.7 09/26/2009 0417   GFRNONAA >60 09/26/2009 0417   GFRAA  09/26/2009 0417    >60        The eGFR has been calculated using the MDRD equation. This calculation has not been validated in all clinical situations. eGFR's persistently <60 mL/min signify possible Chronic Kidney Disease.   Lab Results  Component Value Date   CHOL  09/25/2009    150        ATP III CLASSIFICATION:  <200     mg/dL   Desirable  200-239  mg/dL   Borderline High  >=240    mg/dL   High          HDL 32 (L) 09/25/2009   LDLCALC (H) 09/25/2009    104        Total Cholesterol/HDL:CHD Risk Coronary Heart Disease Risk Table                     Men   Women  1/2 Average Risk   3.4   3.3  Average Risk       5.0   4.4  2 X Average Risk   9.6   7.1  3 X Average Risk  23.4   11.0        Use the calculated Patient Ratio above and the CHD Risk Table to determine the patient's CHD Risk.        ATP III CLASSIFICATION (LDL):  <100     mg/dL   Optimal  100-129  mg/dL   Near or Above                    Optimal  130-159  mg/dL   Borderline  160-189  mg/dL   High  >190     mg/dL   Very High   TRIG 70 09/25/2009   CHOLHDL 4.7 09/25/2009   Lab Results  Component Value Date   HGBA1C  09/25/2009    5.2 (NOTE)                                                                       According to the ADA Clinical Practice Recommendations for 2011,  when HbA1c is used as a screening test:   >=6.5%   Diagnostic of Diabetes Mellitus           (if abnormal result  is confirmed)  5.7-6.4%   Increased risk of developing Diabetes Mellitus  References:Diagnosis and Classification of Diabetes Mellitus,Diabetes Care,2011,34(Suppl 1):S62-S69 and Standards of Medical Care in         Diabetes - 2011,Diabetes JJOA,4166,06  (Suppl 1):S11-S61.      ASSESSMENT AND PLAN 77 y.o. year old male  has a past medical history of CAD (coronary artery disease), Cancer ([place]) (1999), Dyslipidemia, History of colon cancer, and Post herpetic neuralgia (10/17/2016). here with:  1.  Postherpetic neuralgia  Overall the patient has remained stable.  He will continue on gabapentin 400 mg twice a day as well as amitriptyline 20 mg at bedtime.  I did advise the patient that if the lidocaine cream that he is using is not beneficial we can consider sending in a prescription for compounded cream through [facility].  Patient voiced understanding.  The patient is snoring and has had witnessed apneic events.  We discussed possible sleep study however the patient deferred at this time.  Advised that if his symptoms worsen or he develops new symptoms he should let us know.  He will follow-up in 6 months or sooner if needed.   I spent 15 minutes with the patient. 50% of this time was spent discussing his medications.   Ward Givens, MSN, NP-C 07/31/2017, 11:37 AM [facility] [address], [place], [place] [address] [phone]

## 2017-07-31 NOTE — Progress Notes (Signed)
I have read the note, and I agree with the clinical assessment and plan.  [name] [name]   

## 2017-07-31 NOTE — Patient Instructions (Signed)
Your Plan:  Continue gabapentin 400 mg twice a day Continue Amitriptyline 20 mg at bedtime Consider Sleep study If your symptoms worsen or you develop new symptoms please let us know.    Thank you for coming to see [place] at [facility]. I hope we have been able to provide you high quality care today.  You may receive a patient satisfaction survey over the next few weeks. We would appreciate your feedback and comments so that we may continue to improve ourselves and the health of our patients.

## 2017-09-03 ENCOUNTER — Other Ambulatory Visit (INDEPENDENT_AMBULATORY_CARE_PROVIDER_SITE_OTHER): Payer: Self-pay | Admitting: *Deleted

## 2017-09-03 DIAGNOSIS — Z86010 Personal history of colonic polyps: Secondary | ICD-10-CM

## 2017-10-24 ENCOUNTER — Encounter (INDEPENDENT_AMBULATORY_CARE_PROVIDER_SITE_OTHER): Payer: Self-pay | Admitting: *Deleted

## 2017-10-24 ENCOUNTER — Telehealth (INDEPENDENT_AMBULATORY_CARE_PROVIDER_SITE_OTHER): Payer: Self-pay | Admitting: *Deleted

## 2017-10-24 NOTE — Telephone Encounter (Signed)
Patient needs trilyte 

## 2017-10-27 MED ORDER — PEG 3350-KCL-NA BICARB-NACL 420 G PO SOLR: 4000.0000 mL | Freq: Once | ORAL | 0 refills | Status: AC

## 2017-11-13 ENCOUNTER — Telehealth (INDEPENDENT_AMBULATORY_CARE_PROVIDER_SITE_OTHER): Payer: Self-pay | Admitting: *Deleted

## 2017-11-13 NOTE — Telephone Encounter (Signed)
Referring MD/PCP: tcs   Procedure: hx polyps  Reason/Indication:  Yes, 2014  Has patient had this procedure before?  no  If so, when, by whom and where?    Is there a family history of colon cancer?  no  Who?  What age when diagnosed?    Is patient diabetic?   no      Does patient have prosthetic heart valve or mechanical valve?  no  Do you have a pacemaker?  no  Has patient ever had endocarditis? no  Has patient had joint replacement within last 12 months?  no  Is patient constipated or do they take laxatives? no  Does patient have a history of alcohol/drug use?  no  Is patient on blood thinner such as Coumadin, Plavix and/or Aspirin? yes  Medications: gabapentin 300 mg, amitriptyline bid, asa 81 mg daily, MVI daily, rosuvastatin 10 mg daily, fish oil 300 mg daily, loratadine 10 mg daily  Allergies: nkda  Medication Adjustment per Dr Lindi Adie, NP: asa 2 days  Procedure date & time: 12/10/17 at 830

## 2017-11-13 NOTE — Telephone Encounter (Signed)
agree

## 2017-12-08 DIAGNOSIS — E782 Mixed hyperlipidemia: Secondary | ICD-10-CM | POA: Diagnosis not present

## 2017-12-08 DIAGNOSIS — E7800 Pure hypercholesterolemia, unspecified: Secondary | ICD-10-CM | POA: Diagnosis not present

## 2017-12-08 DIAGNOSIS — R739 Hyperglycemia, unspecified: Secondary | ICD-10-CM | POA: Diagnosis not present

## 2017-12-08 DIAGNOSIS — E8881 Metabolic syndrome: Secondary | ICD-10-CM | POA: Diagnosis not present

## 2017-12-08 DIAGNOSIS — R7301 Impaired fasting glucose: Secondary | ICD-10-CM | POA: Diagnosis not present

## 2017-12-10 ENCOUNTER — Ambulatory Visit (HOSPITAL_COMMUNITY)
Admission: RE | Admit: 2017-12-10 | Discharge: 2017-12-10 | Disposition: A | Discharge status: Home / Self Care | Payer: Medicare HMO | Source: Ambulatory Visit | Attending: Internal Medicine | Admitting: Internal Medicine

## 2017-12-10 ENCOUNTER — Encounter (HOSPITAL_COMMUNITY): Payer: Self-pay | Admitting: *Deleted

## 2017-12-10 ENCOUNTER — Encounter (HOSPITAL_COMMUNITY)
Admission: RE | Disposition: A | Discharge status: Home / Self Care | Payer: Self-pay | Source: Ambulatory Visit | Attending: Internal Medicine

## 2017-12-10 ENCOUNTER — Other Ambulatory Visit: Payer: Self-pay

## 2017-12-10 DIAGNOSIS — Z87891 Personal history of nicotine dependence: Secondary | ICD-10-CM | POA: Insufficient documentation

## 2017-12-10 DIAGNOSIS — K644 Residual hemorrhoidal skin tags: Secondary | ICD-10-CM | POA: Insufficient documentation

## 2017-12-10 DIAGNOSIS — D123 Benign neoplasm of transverse colon: Secondary | ICD-10-CM | POA: Insufficient documentation

## 2017-12-10 DIAGNOSIS — Z9049 Acquired absence of other specified parts of digestive tract: Secondary | ICD-10-CM | POA: Diagnosis not present

## 2017-12-10 DIAGNOSIS — Z1211 Encounter for screening for malignant neoplasm of colon: Secondary | ICD-10-CM | POA: Insufficient documentation

## 2017-12-10 DIAGNOSIS — I2510 Atherosclerotic heart disease of native coronary artery without angina pectoris: Secondary | ICD-10-CM | POA: Diagnosis not present

## 2017-12-10 DIAGNOSIS — Z79899 Other long term (current) drug therapy: Secondary | ICD-10-CM | POA: Diagnosis not present

## 2017-12-10 DIAGNOSIS — Z85038 Personal history of other malignant neoplasm of large intestine: Secondary | ICD-10-CM | POA: Diagnosis not present

## 2017-12-10 DIAGNOSIS — Z800 Family history of malignant neoplasm of digestive organs: Secondary | ICD-10-CM | POA: Diagnosis not present

## 2017-12-10 DIAGNOSIS — Z980 Intestinal bypass and anastomosis status: Secondary | ICD-10-CM | POA: Insufficient documentation

## 2017-12-10 DIAGNOSIS — Z7982 Long term (current) use of aspirin: Secondary | ICD-10-CM | POA: Diagnosis not present

## 2017-12-10 DIAGNOSIS — I252 Old myocardial infarction: Secondary | ICD-10-CM | POA: Diagnosis not present

## 2017-12-10 DIAGNOSIS — E785 Hyperlipidemia, unspecified: Secondary | ICD-10-CM | POA: Diagnosis not present

## 2017-12-10 DIAGNOSIS — D122 Benign neoplasm of ascending colon: Secondary | ICD-10-CM | POA: Insufficient documentation

## 2017-12-10 DIAGNOSIS — Z08 Encounter for follow-up examination after completed treatment for malignant neoplasm: Secondary | ICD-10-CM | POA: Diagnosis not present

## 2017-12-10 DIAGNOSIS — Z860100 Personal history of colon polyps, unspecified: Secondary | ICD-10-CM | POA: Insufficient documentation

## 2017-12-10 HISTORY — PX: POLYPECTOMY: SHX5525

## 2017-12-10 HISTORY — PX: COLONOSCOPY: SHX5424

## 2017-12-10 SURGERY — COLONOSCOPY
Anesthesia: Moderate Sedation

## 2017-12-10 MED ORDER — MEPERIDINE HCL 50 MG/ML IJ SOLN
INTRAMUSCULAR | Status: DC | PRN
  Administered 2017-12-10 (×2): 25 mg via INTRAVENOUS

## 2017-12-10 MED ORDER — STERILE WATER FOR IRRIGATION IR SOLN
Status: DC | PRN
  Administered 2017-12-10: 08:00:00

## 2017-12-10 MED ORDER — SODIUM CHLORIDE 0.9 % IV SOLN
INTRAVENOUS | Status: DC
  Administered 2017-12-10: 1000 mL via INTRAVENOUS

## 2017-12-10 MED ORDER — MEPERIDINE HCL 50 MG/ML IJ SOLN
INTRAMUSCULAR | Status: AC
  Filled 2017-12-10: qty 1

## 2017-12-10 MED ORDER — MIDAZOLAM HCL 5 MG/5ML IJ SOLN
INTRAMUSCULAR | Status: DC | PRN
  Administered 2017-12-10: 1 mg via INTRAVENOUS
  Administered 2017-12-10: 2 mg via INTRAVENOUS
  Administered 2017-12-10: 1 mg via INTRAVENOUS

## 2017-12-10 MED ORDER — MIDAZOLAM HCL 5 MG/5ML IJ SOLN
INTRAMUSCULAR | Status: AC
  Filled 2017-12-10: qty 10

## 2017-12-10 NOTE — H&P (Signed)
Seth Higgins is an 77 y.o. male.   Chief Complaint: Patient is here for colonoscopy. HPI: 77 year old Caucasian male with history of colon carcinoma and colonic adenomas is here for surveillance exam.  He had he had sigmoid colon resection 20 years ago.  Last colonoscopy was in February 2014 with coagulation of 3 polyps and removal of 2 polyps and these were tubular adenomas.  He denies abdominal pain change in bowel habits or rectal bleeding. Family history is significant for CRC and father who was possibly in his 42s at the time of diagnosis and lived to be 35.  Past Medical History:  Diagnosis Date  . CAD (coronary artery disease)    NSTEMI/DES RCA, 05/11. EF 50%,inferoapical HK  . Cancer [facility]) 1999   Colon Cancer  . Dyslipidemia   . History of colon cancer      . Post herpetic neuralgia 10/17/2016   Left C2-3    Past Surgical History:  Procedure Laterality Date  . CARDIAC CATHETERIZATION  09/2009   with stent placement  . COLON SURGERY  1999  . COLONOSCOPY [name] 07/01/2012   Procedure: COLONOSCOPY;  Surgeon: Rogene Houston, MD;  Location[facility];  Service: Endoscopy;  Laterality: [name];  830  . HERNIA REPAIR     LEFT    Family History  Problem Relation Age of Onset  . Coronary artery disease Father        had CABG in 1970  . Colon cancer Father    Social History:  reports that he quit smoking about 49 years ago. His smoking use included cigarettes. He has a 10.00 pack-year smoking history. He has never used smokeless tobacco. He reports that he does not drink alcohol or use drugs.  Allergies: No Known Allergies  Medications Prior to Admission  Medication Sig Dispense Refill  . amitriptyline (ELAVIL) 10 MG tablet Take 2 tablets (20 mg total) by mouth at bedtime. 180 tablet 3  . aspirin 81 MG tablet Take 81 mg by mouth daily.    Marland Kitchen gabapentin (NEURONTIN) 300 MG capsule Take 300 mg by mouth at bedtime.    . Lidocaine 4 % LOTN Apply 1 g topically 3 (three) times daily as  needed. (Patient taking differently: Apply 1 g topically 3 (three) times daily as needed (pain from shingles on neck). ) 1 Bottle 5  . loratadine (CLARITIN) 10 MG tablet Take 10 mg by mouth daily.    . Multiple Vitamins-Minerals (MULTIVITAMIN ADULTS PO) Take 1 tablet by mouth daily.    . Omega-3 Fatty Acids (FISH OIL OMEGA-3) 1000 MG CAPS Take 1,000 mg by mouth every other day.    . rosuvastatin (CRESTOR) 40 MG tablet Take 10 mg by mouth at bedtime.   4  . nitroGLYCERIN (NITROSTAT) 0.4 MG SL tablet Place 1 tablet (0.4 mg total) under the tongue every 5 (five) minutes as needed. 25 tablet 3    No results found for this or any previous visit (from the past 48 hour(s)). No results found.  ROS  Blood pressure 117/67, pulse 64, temperature 97.9 F (36.6 C), temperature source Oral, resp. rate 10, height 6\' 1"  (1.854 m), weight 180 lb (81.6 kg), SpO2 97 %. Physical Exam  Constitutional: He appears well-developed and well-nourished.  HENT:  Mouth/Throat: Oropharynx is clear and moist.  Eyes: Conjunctivae are normal. No scleral icterus.  Neck: No thyromegaly present.  Cardiovascular: Normal rate, regular rhythm and normal heart sounds.  No murmur heard. Respiratory: Effort normal and breath sounds  normal.  GI:  Abdomen is symmetrical with long midline scar.  On palpation is soft and nontender with organomegaly or masses.  Musculoskeletal: He exhibits no edema.  Lymphadenopathy:    He has no cervical adenopathy.  Neurological: He is alert.  Skin: Skin is warm and dry.     Assessment/Plan History of colon carcinoma and colonic adenomas. Surveillance colonoscopy.  Hildred Laser, MD 12/10/2017, 8:23 AM

## 2017-12-10 NOTE — Op Note (Signed)
[facility] Patient Name: Seth Higgins Procedure Date: 12/10/2017 8:14 AM MRN: [id number] Date of Birth: 11-03-1940 Attending MD: Hildred Laser , MD CSN: [id number] Age: 77 Admit Type: Outpatient Procedure:                Colonoscopy Indications:              High risk colon cancer surveillance: Personal                            history of colon cancer Providers:                Hildred Laser, MD, Otis Peak B. Sharon Seller, RN, Randa Spike, Technician Referring MD:             Manon Hilding, MD Medicines:                Meperidine 50 mg IV, Midazolam 4 mg IV Complications:            No immediate complications. Estimated Blood Loss:     Estimated blood loss was minimal. Procedure:                Pre-Anesthesia Assessment:                           - Prior to the procedure, a History and Physical                            was performed, and patient medications and                            allergies were reviewed. The patient's tolerance of                            previous anesthesia was also reviewed. The risks                            and benefits of the procedure and the sedation                            options and risks were discussed with the patient.                            All questions were answered, and informed consent                            was obtained. Prior Anticoagulants: The patient                            last took aspirin 3 days prior to the procedure.                            ASA Grade Assessment: II - A patient with mild  systemic disease. After reviewing the risks and                            benefits, the patient was deemed in satisfactory                            condition to undergo the procedure.                           After obtaining informed consent, the colonoscope                            was passed under direct vision. Throughout the                             procedure, the patient's blood pressure, pulse, and                            oxygen saturations were monitored continuously. The                            [id number]-[id number] ([id number]) scope was introduced through                            the anus and advanced to the the cecum, identified                            by appendiceal orifice and ileocecal valve. The                            colonoscopy was performed without difficulty. The                            patient tolerated the procedure well. The quality                            of the bowel preparation was excellent. The                            ileocecal valve, appendiceal orifice, and rectum                            were photographed. Scope In: 8:33:16 AM Scope Out: 8:53:50 AM Scope Withdrawal Time: 0 hours 16 minutes 35 seconds  Total Procedure Duration: 0 hours 20 minutes 34 seconds  Findings:      The perianal and digital rectal examinations were normal.      Three sessile polyps were found in the splenic flexure, transverse colon       and ascending colon. The polyps were small in size. These were biopsied       with a cold forceps for histology. The pathology specimen was placed       into Bottle Number 1.      There was evidence of a prior end-to-end colo-rectal anastomosis at 15       cm proximal  to the anus. This was patent and was characterized by       healthy appearing mucosa and visible sutures.      External hemorrhoids were found during retroflexion. The hemorrhoids       were small. Impression:               - Three small polyps at the splenic flexure, in the                            transverse colon and in the ascending colon.                            Biopsied.                           - Patent end-to-end colo-rectal anastomosis,                            characterized by healthy appearing mucosa and                            visible sutures.                           - External  hemorrhoids. Moderate Sedation:      Moderate (conscious) sedation was administered by the endoscopy nurse       and supervised by the endoscopist. The following parameters were       monitored: oxygen saturation, heart rate, blood pressure, CO2       capnography and response to care. Total physician intraservice time was       25 minutes. Recommendation:           - Patient has a contact number available for                            emergencies. The signs and symptoms of potential                            delayed complications were discussed with the                            patient. Return to normal activities tomorrow.                            Written discharge instructions were provided to the                            patient.                           - Resume previous diet today.                           - Continue present medications.                           - Resume aspirin at prior dose tomorrow.                           -  Await pathology results.                           - Repeat colonoscopy in 5 years for surveillance. Procedure Code(s):        --- Professional ---                           [phone], Colonoscopy, flexible; with biopsy, single                            or multiple                           G0500, Moderate sedation services provided by the                            same physician or other qualified health care                            professional performing a gastrointestinal                            endoscopic service that sedation supports,                            requiring the presence of an independent trained                            observer to assist in the monitoring of the                            patient's level of consciousness and physiological                            status; initial 15 minutes of intra-service time;                            patient age 96 years or older (additional time may                             be reported with [phone], as appropriate)                           [phone], Moderate sedation services provided by the                            same physician or other qualified health care                            professional performing the diagnostic or                            therapeutic service that the sedation supports,  requiring the presence of an independent trained                            observer to assist in the monitoring of the                            patient's level of consciousness and physiological                            status; each additional 15 minutes intraservice                            time (List separately in addition to code for                            primary service) Diagnosis Code(s):        --- Professional ---                           J19.417, Personal history of other malignant                            neoplasm of large intestine                           D12.3, Benign neoplasm of transverse colon (hepatic                            flexure or splenic flexure)                           D12.2, Benign neoplasm of ascending colon                           Z98.0, Intestinal bypass and anastomosis status                           K64.4, Residual hemorrhoidal skin tags CPT copyright 2017 American Medical Association. All rights reserved. The codes documented in this report are preliminary and upon coder review may  be revised to meet current compliance requirements. Hildred Laser, MD Hildred Laser, MD 12/10/2017 9:03:28 AM This report has been signed electronically. Number of Addenda: 0

## 2017-12-10 NOTE — Discharge Instructions (Signed)
No aspirin or NSAIDs for 24 hours. Resume other medications and diet as before. No driving for 24 hours. Physician will call with biopsy results. Next colonoscopy in 5 years.      Colonoscopy, Adult, Care After This sheet gives you information about how to care for yourself after your procedure. Your doctor may also give you more specific instructions. If you have problems or questions, call your doctor. Follow these instructions at home: General instructions   For the first 24 hours after the procedure: ? Do not drive or use machinery. ? Do not sign important documents. ? Do not drink alcohol. ? Do your daily activities more slowly than normal. ? Eat foods that are soft and easy to digest. ? Rest often.  Take over-the-counter or prescription medicines only as told by your doctor.  It is up to you to get the results of your procedure. Ask your doctor, or the department performing the procedure, when your results will be ready. To help cramping and bloating:  Try walking around.  Put heat on your belly (abdomen) as told by your doctor. Use a heat source that your doctor recommends, such as a moist heat pack or a heating pad. ? Put a towel between your skin and the heat source. ? Leave the heat on for 20-30 minutes. ? Remove the heat if your skin turns bright red. This is especially important if you cannot feel pain, heat, or cold. You can get burned. Eating and drinking  Drink enough fluid to keep your pee (urine) clear or pale yellow.  Return to your normal diet as told by your doctor. Avoid heavy or fried foods that are hard to digest.  Avoid drinking alcohol for as long as told by your doctor. Contact a doctor if:  You have blood in your poop (stool) 2-3 days after the procedure. Get help right away if:  You have more than a small amount of blood in your poop.  You see large clumps of tissue (blood clots) in your poop.  Your belly is swollen.  You feel sick to  your stomach (nauseous).  You throw up (vomit).  You have a fever.  You have belly pain that gets worse, and medicine does not help your pain. This information is not intended to replace advice given to you by your health care provider. Make sure you discuss any questions you have with your health care provider. Document Released: 06/08/2010 Document Revised: 01/29/2016 Document Reviewed: 01/29/2016 Elsevier Interactive Patient Education  2017 [place].    Colon Polyps Polyps are tissue growths inside the body. Polyps can grow in many places, including the large intestine (colon). A polyp may be a round bump or a mushroom-shaped growth. You could have one polyp or several. Most colon polyps are noncancerous (benign). However, some colon polyps can become cancerous over time. What are the causes? The exact cause of colon polyps is not known. What increases the risk? This condition is more likely to develop in people who:  Have a family history of colon cancer or colon polyps.  Are older than 3 or older than 45 if they are African American.  Have inflammatory bowel disease, such as ulcerative colitis or Crohn disease.  Are overweight.  Smoke cigarettes.  Do not get enough exercise.  Drink too much alcohol.  Eat a diet that is: ? High in fat and red meat. ? Low in fiber.  Had childhood cancer that was treated with abdominal radiation.  What are  the signs or symptoms? Most polyps do not cause symptoms. If you have symptoms, they may include:  Blood coming from your rectum when having a bowel movement.  Blood in your stool.The stool may look dark red or black.  A change in bowel habits, such as constipation or diarrhea.  How is this diagnosed? This condition is diagnosed with a colonoscopy. This is a procedure that uses a lighted, flexible scope to look at the inside of your colon. How is this treated? Treatment for this condition involves removing any polyps  that are found. Those polyps will then be tested for cancer. If cancer is found, your health care provider will talk to you about options for colon cancer treatment. Follow these instructions at home: Diet  Eat plenty of fiber, such as fruits, vegetables, and whole grains.  Eat foods that are high in calcium and vitamin D, such as milk, cheese, yogurt, eggs, liver, fish, and broccoli.  Limit foods high in fat, red meats, and processed meats, such as hot dogs, sausage, bacon, and lunch meats.  Maintain a healthy weight, or lose weight if recommended by your health care provider. General instructions  Do not smoke cigarettes.  Do not drink alcohol excessively.  Keep all follow-up visits as told by your health care provider. This is important. This includes keeping regularly scheduled colonoscopies. Talk to your health care provider about when you need a colonoscopy.  Exercise every day or as told by your health care provider. Contact a health care provider if:  You have new or worsening bleeding during a bowel movement.  You have new or increased blood in your stool.  You have a change in bowel habits.  You unexpectedly lose weight. This information is not intended to replace advice given to you by your health care provider. Make sure you discuss any questions you have with your health care provider. Document Released: 01/31/2004 Document Revised: 10/12/2015 Document Reviewed: 03/27/2015 Elsevier Interactive Patient Education  [employer].

## 2017-12-15 ENCOUNTER — Encounter (HOSPITAL_COMMUNITY): Payer: Self-pay | Admitting: Internal Medicine

## 2017-12-17 DIAGNOSIS — Z6823 Body mass index (BMI) 23.0-23.9, adult: Secondary | ICD-10-CM | POA: Diagnosis not present

## 2017-12-17 DIAGNOSIS — J3489 Other specified disorders of nose and nasal sinuses: Secondary | ICD-10-CM | POA: Diagnosis not present

## 2017-12-17 DIAGNOSIS — R739 Hyperglycemia, unspecified: Secondary | ICD-10-CM | POA: Diagnosis not present

## 2017-12-17 DIAGNOSIS — E782 Mixed hyperlipidemia: Secondary | ICD-10-CM | POA: Diagnosis not present

## 2017-12-17 DIAGNOSIS — Z1389 Encounter for screening for other disorder: Secondary | ICD-10-CM | POA: Diagnosis not present

## 2017-12-17 DIAGNOSIS — I2510 Atherosclerotic heart disease of native coronary artery without angina pectoris: Secondary | ICD-10-CM | POA: Diagnosis not present

## 2017-12-17 DIAGNOSIS — Z1331 Encounter for screening for depression: Secondary | ICD-10-CM | POA: Diagnosis not present

## 2017-12-17 DIAGNOSIS — B0223 Postherpetic polyneuropathy: Secondary | ICD-10-CM | POA: Diagnosis not present

## 2018-01-14 DIAGNOSIS — L570 Actinic keratosis: Secondary | ICD-10-CM | POA: Diagnosis not present

## 2018-01-22 ENCOUNTER — Ambulatory Visit (INDEPENDENT_AMBULATORY_CARE_PROVIDER_SITE_OTHER): Payer: Medicare HMO | Admitting: Otolaryngology

## 2018-01-22 DIAGNOSIS — J343 Hypertrophy of nasal turbinates: Secondary | ICD-10-CM

## 2018-01-22 DIAGNOSIS — J322 Chronic ethmoidal sinusitis: Secondary | ICD-10-CM

## 2018-01-22 DIAGNOSIS — J320 Chronic maxillary sinusitis: Secondary | ICD-10-CM

## 2018-01-27 ENCOUNTER — Other Ambulatory Visit (INDEPENDENT_AMBULATORY_CARE_PROVIDER_SITE_OTHER): Payer: Self-pay | Admitting: Otolaryngology

## 2018-01-27 DIAGNOSIS — J320 Chronic maxillary sinusitis: Secondary | ICD-10-CM

## 2018-01-30 ENCOUNTER — Other Ambulatory Visit: Payer: Self-pay | Admitting: Adult Health

## 2018-02-02 ENCOUNTER — Other Ambulatory Visit: Payer: Self-pay | Admitting: Adult Health

## 2018-02-05 ENCOUNTER — Ambulatory Visit (HOSPITAL_COMMUNITY)
Admission: RE | Admit: 2018-02-05 | Discharge: 2018-02-05 | Disposition: A | Discharge status: Home / Self Care | Payer: Medicare HMO | Source: Ambulatory Visit | Attending: Otolaryngology | Admitting: Otolaryngology

## 2018-02-05 DIAGNOSIS — J3489 Other specified disorders of nose and nasal sinuses: Secondary | ICD-10-CM | POA: Diagnosis not present

## 2018-02-05 DIAGNOSIS — B9689 Other specified bacterial agents as the cause of diseases classified elsewhere: Secondary | ICD-10-CM | POA: Diagnosis not present

## 2018-02-05 DIAGNOSIS — J320 Chronic maxillary sinusitis: Secondary | ICD-10-CM | POA: Insufficient documentation

## 2018-03-19 ENCOUNTER — Ambulatory Visit (INDEPENDENT_AMBULATORY_CARE_PROVIDER_SITE_OTHER): Payer: Medicare HMO | Admitting: Otolaryngology

## 2018-03-19 DIAGNOSIS — J323 Chronic sphenoidal sinusitis: Secondary | ICD-10-CM | POA: Diagnosis not present

## 2018-03-19 DIAGNOSIS — J320 Chronic maxillary sinusitis: Secondary | ICD-10-CM | POA: Diagnosis not present

## 2018-03-19 DIAGNOSIS — J342 Deviated nasal septum: Secondary | ICD-10-CM

## 2018-05-05 ENCOUNTER — Other Ambulatory Visit: Payer: Self-pay | Admitting: Otolaryngology

## 2018-05-06 NOTE — Progress Notes (Signed)
Reviewing chart as pt is scheduled for nasal surgery at [facility]. Noted history of CAD, cardiac cath with stents - note from 2013 states pt is to follwup with cardio in 20mos. No notes since 2013 found. Notified Heather at Dr Deeann Saint office who will contact pt and arrange for cardiac followup/clearance prior to surgery.

## 2018-05-07 ENCOUNTER — Encounter (HOSPITAL_BASED_OUTPATIENT_CLINIC_OR_DEPARTMENT_OTHER): Payer: Self-pay | Admitting: *Deleted

## 2018-05-07 ENCOUNTER — Other Ambulatory Visit: Payer: Self-pay

## 2018-05-07 NOTE — Progress Notes (Signed)
Per wife pt was released by cardiology in 2013 and to told to f/u with PCP.  Notes from Dr McDowell's PA Aurora Mask from that visit reviewed with Dr. Tobias Alexander. Since pt is free of cardiac symptoms, very active walking and working out at [employer], he will not need further clearance. To stop asa 3 days prior to surgery per PCP instructions who manage.

## 2018-05-18 ENCOUNTER — Ambulatory Visit (HOSPITAL_BASED_OUTPATIENT_CLINIC_OR_DEPARTMENT_OTHER): Payer: Medicare HMO | Admitting: Anesthesiology

## 2018-05-18 ENCOUNTER — Encounter (HOSPITAL_BASED_OUTPATIENT_CLINIC_OR_DEPARTMENT_OTHER)
Admission: RE | Disposition: A | Discharge status: Home / Self Care | Payer: Self-pay | Source: Home / Self Care | Attending: Otolaryngology

## 2018-05-18 ENCOUNTER — Encounter (HOSPITAL_BASED_OUTPATIENT_CLINIC_OR_DEPARTMENT_OTHER): Payer: Self-pay | Admitting: *Deleted

## 2018-05-18 ENCOUNTER — Ambulatory Visit (HOSPITAL_BASED_OUTPATIENT_CLINIC_OR_DEPARTMENT_OTHER)
Admission: RE | Admit: 2018-05-18 | Discharge: 2018-05-18 | Disposition: A | Discharge status: Home / Self Care | Payer: Medicare HMO | Attending: Otolaryngology | Admitting: Otolaryngology

## 2018-05-18 DIAGNOSIS — J322 Chronic ethmoidal sinusitis: Secondary | ICD-10-CM | POA: Insufficient documentation

## 2018-05-18 DIAGNOSIS — J3489 Other specified disorders of nose and nasal sinuses: Secondary | ICD-10-CM | POA: Diagnosis not present

## 2018-05-18 DIAGNOSIS — B49 Unspecified mycosis: Secondary | ICD-10-CM | POA: Diagnosis not present

## 2018-05-18 DIAGNOSIS — J343 Hypertrophy of nasal turbinates: Secondary | ICD-10-CM | POA: Insufficient documentation

## 2018-05-18 DIAGNOSIS — J342 Deviated nasal septum: Secondary | ICD-10-CM | POA: Insufficient documentation

## 2018-05-18 DIAGNOSIS — Z87891 Personal history of nicotine dependence: Secondary | ICD-10-CM | POA: Diagnosis not present

## 2018-05-18 DIAGNOSIS — J323 Chronic sphenoidal sinusitis: Secondary | ICD-10-CM | POA: Diagnosis not present

## 2018-05-18 DIAGNOSIS — J338 Other polyp of sinus: Secondary | ICD-10-CM | POA: Diagnosis not present

## 2018-05-18 DIAGNOSIS — J320 Chronic maxillary sinusitis: Secondary | ICD-10-CM | POA: Insufficient documentation

## 2018-05-18 HISTORY — PX: NASAL SEPTOPLASTY W/ TURBINOPLASTY: SHX2070

## 2018-05-18 HISTORY — PX: SINUS ENDO W/FUSION: SHX777

## 2018-05-18 HISTORY — PX: SEPTOPLASTY WITH ETHMOIDECTOMY, AND MAXILLARY ANTROSTOMY: SHX6090

## 2018-05-18 HISTORY — PX: MAXILLARY ANTROSTOMY: SHX2003

## 2018-05-18 SURGERY — MAXILLARY ANTROSTOMY
Anesthesia: General | Site: Nose | Laterality: Right

## 2018-05-18 MED ORDER — SUGAMMADEX SODIUM 500 MG/5ML IV SOLN
INTRAVENOUS | Status: AC
  Filled 2018-05-18: qty 5

## 2018-05-18 MED ORDER — FENTANYL CITRATE (PF) 100 MCG/2ML IJ SOLN
25.0000 ug | INTRAMUSCULAR | Status: DC | PRN
  Administered 2018-05-18 (×2): 50 ug via INTRAVENOUS

## 2018-05-18 MED ORDER — AMOXICILLIN 875 MG PO TABS: 875.0000 mg | ORAL_TABLET | Freq: Two times a day (BID) | ORAL | 0 refills | Status: AC

## 2018-05-18 MED ORDER — MUPIROCIN 2 % EX OINT
TOPICAL_OINTMENT | CUTANEOUS | Status: AC
  Filled 2018-05-18: qty 22

## 2018-05-18 MED ORDER — SUGAMMADEX SODIUM 500 MG/5ML IV SOLN
INTRAVENOUS | Status: DC | PRN
  Administered 2018-05-18: 350 mg via INTRAVENOUS

## 2018-05-18 MED ORDER — SCOPOLAMINE 1 MG/3DAYS TD PT72: 1.0000 | MEDICATED_PATCH | Freq: Once | TRANSDERMAL | Status: DC | PRN

## 2018-05-18 MED ORDER — ROCURONIUM BROMIDE 50 MG/5ML IV SOSY
PREFILLED_SYRINGE | INTRAVENOUS | Status: AC
  Filled 2018-05-18: qty 5

## 2018-05-18 MED ORDER — LIDOCAINE 2% (20 MG/ML) 5 ML SYRINGE
INTRAMUSCULAR | Status: AC
  Filled 2018-05-18: qty 10

## 2018-05-18 MED ORDER — FENTANYL CITRATE (PF) 100 MCG/2ML IJ SOLN
INTRAMUSCULAR | Status: AC
  Filled 2018-05-18: qty 2

## 2018-05-18 MED ORDER — PROMETHAZINE HCL 25 MG/ML IJ SOLN: 6.2500 mg | INTRAMUSCULAR | Status: DC | PRN

## 2018-05-18 MED ORDER — ROCURONIUM BROMIDE 100 MG/10ML IV SOLN
INTRAVENOUS | Status: DC | PRN
  Administered 2018-05-18: 50 mg via INTRAVENOUS

## 2018-05-18 MED ORDER — ONDANSETRON HCL 4 MG/2ML IJ SOLN
INTRAMUSCULAR | Status: DC | PRN
  Administered 2018-05-18: 4 mg via INTRAVENOUS

## 2018-05-18 MED ORDER — FENTANYL CITRATE (PF) 100 MCG/2ML IJ SOLN
50.0000 ug | INTRAMUSCULAR | Status: DC | PRN
  Administered 2018-05-18: 100 ug via INTRAVENOUS

## 2018-05-18 MED ORDER — PHENYLEPHRINE HCL 10 MG/ML IJ SOLN
INTRAMUSCULAR | Status: DC | PRN
  Administered 2018-05-18: 120 ug via INTRAVENOUS

## 2018-05-18 MED ORDER — DEXAMETHASONE SODIUM PHOSPHATE 10 MG/ML IJ SOLN
INTRAMUSCULAR | Status: AC
  Filled 2018-05-18: qty 1

## 2018-05-18 MED ORDER — MIDAZOLAM HCL 2 MG/2ML IJ SOLN: 1.0000 mg | INTRAMUSCULAR | Status: DC | PRN

## 2018-05-18 MED ORDER — LIDOCAINE-EPINEPHRINE 1 %-1:100000 IJ SOLN
INTRAMUSCULAR | Status: AC
  Filled 2018-05-18: qty 1

## 2018-05-18 MED ORDER — MIDAZOLAM HCL 2 MG/2ML IJ SOLN: 0.5000 mg | Freq: Once | INTRAMUSCULAR | Status: DC | PRN

## 2018-05-18 MED ORDER — OXYMETAZOLINE HCL 0.05 % NA SOLN
NASAL | Status: AC
  Filled 2018-05-18: qty 15

## 2018-05-18 MED ORDER — PROPOFOL 10 MG/ML IV BOLUS
INTRAVENOUS | Status: DC | PRN
  Administered 2018-05-18: 90 mg via INTRAVENOUS

## 2018-05-18 MED ORDER — LACTATED RINGERS IV SOLN
INTRAVENOUS | Status: DC
  Administered 2018-05-18 (×2): via INTRAVENOUS

## 2018-05-18 MED ORDER — MEPERIDINE HCL 25 MG/ML IJ SOLN: 6.2500 mg | INTRAMUSCULAR | Status: DC | PRN

## 2018-05-18 MED ORDER — OXYCODONE-ACETAMINOPHEN 5-325 MG PO TABS: 1.0000 | ORAL_TABLET | ORAL | 0 refills | Status: DC | PRN

## 2018-05-18 MED ORDER — LIDOCAINE HCL (CARDIAC) PF 100 MG/5ML IV SOSY
PREFILLED_SYRINGE | INTRAVENOUS | Status: DC | PRN
  Administered 2018-05-18: 40 mg via INTRAVENOUS

## 2018-05-18 MED ORDER — ONDANSETRON HCL 4 MG/2ML IJ SOLN
INTRAMUSCULAR | Status: AC
  Filled 2018-05-18: qty 2

## 2018-05-18 MED ORDER — PROPOFOL 10 MG/ML IV BOLUS
INTRAVENOUS | Status: AC
  Filled 2018-05-18: qty 20

## 2018-05-18 MED ORDER — CEFAZOLIN SODIUM-DEXTROSE 2-3 GM-%(50ML) IV SOLR
INTRAVENOUS | Status: DC | PRN
  Administered 2018-05-18: 2 g via INTRAVENOUS

## 2018-05-18 MED ORDER — LIDOCAINE-EPINEPHRINE 1 %-1:100000 IJ SOLN
INTRAMUSCULAR | Status: DC | PRN
  Administered 2018-05-18: 5 mL

## 2018-05-18 MED ORDER — DEXAMETHASONE SODIUM PHOSPHATE 4 MG/ML IJ SOLN
INTRAMUSCULAR | Status: DC | PRN
  Administered 2018-05-18: 10 mg via INTRAVENOUS

## 2018-05-18 SURGICAL SUPPLY — 40 items
ATTRACTOMAT 16X20 MAGNETIC DRP (DRAPES) IMPLANT
BLADE RAD40 ROTATE 4M 4 5PK (BLADE) IMPLANT
BLADE RAD60 ROTATE M4 4 5PK (BLADE) IMPLANT
BLADE TRICUT ROTATE M4 4 5PK (BLADE) IMPLANT
BUR HS RAD FRONTAL 3 (BURR) IMPLANT
CANISTER SUC SOCK COL 7IN (MISCELLANEOUS) ×4 IMPLANT
CANISTER SUCT 1200ML W/VALVE (MISCELLANEOUS) ×4 IMPLANT
COAGULATOR SUCT 8FR VV (MISCELLANEOUS) IMPLANT
COAGULATOR SUCT SWTCH 10FR 6 (ELECTROSURGICAL) ×4 IMPLANT
COVER WAND RF STERILE (DRAPES) IMPLANT
DECANTER SPIKE VIAL GLASS SM (MISCELLANEOUS) ×4 IMPLANT
DRSG NASAL KENNEDY LMNT 8CM (GAUZE/BANDAGES/DRESSINGS) IMPLANT
DRSG NASOPORE 8CM (GAUZE/BANDAGES/DRESSINGS) IMPLANT
DRSG TELFA 3X8 NADH (GAUZE/BANDAGES/DRESSINGS) IMPLANT
ELECT REM PT RETURN 9FT ADLT (ELECTROSURGICAL) ×4
ELECTRODE REM PT RTRN 9FT ADLT (ELECTROSURGICAL) ×3 IMPLANT
GLOVE BIO SURGEON STRL SZ7.5 (GLOVE) ×4 IMPLANT
GOWN STRL REUS W/ TWL LRG LVL3 (GOWN DISPOSABLE) ×6 IMPLANT
GOWN STRL REUS W/TWL LRG LVL3 (GOWN DISPOSABLE) ×2
HEMOSTAT SURGICEL 2X14 (HEMOSTASIS) IMPLANT
IV NS 500ML (IV SOLUTION) ×1
IV NS 500ML BAXH (IV SOLUTION) ×3 IMPLANT
IV SET EXT 30 76VOL 4 MALE LL (IV SETS) IMPLANT
NEEDLE HYPO 25X1 1.5 SAFETY (NEEDLE) ×4 IMPLANT
NEEDLE SPNL 25GX3.5 QUINCKE BL (NEEDLE) IMPLANT
NS IRRIG 1000ML POUR BTL (IV SOLUTION) IMPLANT
PACK BASIN DAY SURGERY FS (CUSTOM PROCEDURE TRAY) ×4 IMPLANT
PACK ENT DAY SURGERY (CUSTOM PROCEDURE TRAY) ×4 IMPLANT
PACKING NASAL EPIS 4X2.4 XEROG (MISCELLANEOUS) IMPLANT
SLEEVE SCD COMPRESS KNEE MED (MISCELLANEOUS) IMPLANT
SOLUTION BUTLER CLEAR DIP (MISCELLANEOUS) ×4 IMPLANT
SPLINT NASAL AIRWAY SILICONE (MISCELLANEOUS) ×4 IMPLANT
SPONGE GAUZE 2X2 8PLY STRL LF (GAUZE/BANDAGES/DRESSINGS) ×4 IMPLANT
SPONGE NEURO XRAY DETECT 1X3 (DISPOSABLE) ×4 IMPLANT
SUT ETHILON 3 0 PS 1 (SUTURE) IMPLANT
SUT PLAIN 4 0 ~~LOC~~ 1 (SUTURE) IMPLANT
TOWEL GREEN STERILE FF (TOWEL DISPOSABLE) ×4 IMPLANT
TUBE CONNECTING 20X1/4 (TUBING) ×4 IMPLANT
TUBE SALEM SUMP 16 FR W/ARV (TUBING) ×4 IMPLANT
YANKAUER SUCT BULB TIP NO VENT (SUCTIONS) IMPLANT

## 2018-05-18 NOTE — Op Note (Signed)
DATE OF PROCEDURE: 05/18/2018  OPERATIVE REPORT   SURGEON: Leta Baptist, MD   PREOPERATIVE DIAGNOSES:  1. Chronic right maxillary/ethmoid/sphenoid sinusitis.  2. Severe nasal septal deviation.  3. Bilateral inferior turbinate hypertrophy.  4. Chronic nasal obstruction.  POSTOPERATIVE DIAGNOSES:  1. Chronic right maxillary/ethmoid/sphenoid sinusitis.  2. Severe nasal septal deviation.  3. Bilateral inferior turbinate hypertrophy.  4. Chronic nasal obstruction.  PROCEDURE PERFORMED:  1. Endoscopic right total ethmoidectomy and sphenoidotomy with tissue removal. 2. Endoscopic right maxillary antrostomy with tissue removal. 3. Septoplasty.  4. Bilateral partial inferior turbinate resection.  5. FUSION stereotactic image guidance.  ANESTHESIA: General endotracheal tube anesthesia.   COMPLICATIONS: None.   ESTIMATED BLOOD LOSS: 200 mL.   INDICATION FOR PROCEDURE: Seth Higgins is a 77 y.o. male with a history of chronic rhinosinusitis. The patient has been complaining of foul-smelling odor in his right nasal cavity for the past 9 months. He also has a history of chronic nasal obstruction. He was treated with systemic antibiotics, steroid nasal spray, and OTC allergy medications.  However, the patient continued to be symptomatic. His CT scan showed significant mucosal thickening within the right maxillary sinus. Mucosal inflammation was also noted within the right ethmoid and sphenoid sinuses. The CT also showed a new 9 mm focus of calcified mass within the right sphenoid sinus. On examination, the patient was noted to have bilateral inferior turbinate hypertrophy and significant nasal septal deviation, causing significant nasal obstruction. A significant amount of crusting and mucopurulent drainage were also noted around the middle meatus. Based on the above findings, the decision was made for the patient to undergo the above-stated procedures. The risks, benefits, alternatives, and details of  the procedures were discussed with the patient. Questions were invited and answered. Informed consent was obtained.   DESCRIPTION OF PROCEDURE: The patient was taken to the operating room and placed supine on the operating table. General endotracheal tube anesthesia was administered by the anesthesiologist. The patient was positioned, and prepped and draped in the standard fashion for nasal surgery. Pledgets soaked with Afrin were placed in both nasal cavities for decongestion. The pledgets were subsequently removed. The FUSION stereotactic image marker was placed. The image guidance system was functional throughout the case. The above mentioned severe septal deviation was again noted. 1% lidocaine with 1:100,000 epinephrine was injected onto the nasal septum bilaterally. A hemitransfixion incision was made on the left side. The mucosal flap was carefully elevated on the left side. A cartilaginous incision was made 1 cm superior to the caudal margin of the nasal septum. Mucosal flap was also elevated on the right side in the similar fashion. It should be noted that due to the severe septal deviation, the deviated portion of the cartilaginous and bony septum had to be removed in piecemeal fashion. Once the deviated portions were removed, a straight midline septum was achieved. The septum was then quilted with 4-0 plain gut sutures. The hemitransfixion incision was closed with interrupted 4-0 chromic sutures.   The inferior one half of both hypertrophied inferior turbinate was crossclamped with a Kelly clamp. The inferior one half of each inferior turbinate was then resected with a pair of cross cutting scissors. Hemostasis was achieved with a suction cautery device.   The right nasal cavity was then examined with a 0 endoscope. Mucopurulent drainage was noted from the right middle meatus. Photodocumentation was obtained. The right middle turbinate was medialized. The uncinate process was resected. The  maxillary antrum was entered and enlarged. A large amount  of polypoid tissue and mucopurulent drainage was evacuated from the right maxillary sinus. The right maxillary sinus was copiously irrigated. The bony partitions of the right anterior and posterior ethmoid cavities were taken down. The opening to the right sphenoid sinus was identified and enlarged. A 1 cm partially calcified mass was noted within the right sphenoid sinus. The mass was removed without difficulty. All sinus contents were sent to the pathology department for permanent histologic identification. The sinuses were copiously irrigated.  Doyle splints were applied to the nasal septum. Hemostasis of the sinuses were achieved with the nasopore packing. That concluded the procedure for the patient.  The care of the patient was turned over to the anesthesiologist. The patient was awakened from anesthesia without difficulty. The patient was extubated and transferred to the recovery room in good condition.   OPERATIVE FINDINGS: Severe nasal septal deviation and bilateral inferior turbinate hypertrophy. Chronic rhinosinusitis, involving the right maxillary, ethmoid, and sphenoid sinuses. A fungus ball-like substance was also removed from the right sphenoid sinus.  SPECIMEN: Right sided sinus contents.  FOLLOWUP CARE: The patient be discharged home once he is awake and alert. The patient will be placed on Percocet 1 tablets p.o. q.4 hours p.r.n. pain, and augmentin 875 mg p.o. b.i.d. for 5 days. The patient will follow up in my office in approximately 3 days for splint removal.   [name], MD

## 2018-05-18 NOTE — H&P (Signed)
Cc: Chronic sinusitis  HPI:  The patient is a 77 year old male who returns today for his follow-up evaluation.  The patient was last seen 1 month ago.  At that time, he was complaining of a foul smelling odor in his right nasal cavity.  He was noted to have a large amount of crusting and mucoid drainage from the right middle meatus.  He was treated with Augmentin.   He underwent a sinus CT scan.  The CT showed significant mucosal thickening within the right maxillary sinus.  It should be noted the patient's previous CT in 2016 showed a large soft tissue mass within the right maxillary sinus.  Most of the mass has spontaneously resolved.  The appearance of his current CT is suggestive of previous surgery.  However, the patient is adamant that he did not have any sinus surgery.  The soft tissue mass has apparently eroded the lateral nasal wall.  His CT scan also showed a new 9 mm focus of calcified mass within the right sphenoid mass.  The patient returns today complaining of persistent nasal congestion. The foul smelling odor has decreased after he completed his Augmentin therapy.  He still has difficulty breathing through his nostrils.  His CT scan showed severe nasal septal deviation.   Exam: The nasal cavities were decongested and anesthetised with a combination of oxymetazoline and 4% lidocaine solution.  The flexible scope was inserted into the right nasal cavity.  Endoscopy of the inferior and middle meatus was performed.  Edematous mucosa was noted. A significant amount of crusting and mucoid drainage noted around the maxillary opening.  Nasopharynx was clear.  Turbinates were hypertrophied but without mass.  NSD noted.  The procedure was repeated on the contralateral side.  The patient tolerated the procedure well.  Instructions were given to avoid eating or drinking for 2 hours.    Assessment: 1.  Chronic right maxillary/ethmoid/sphenoid sinusitis.  2.  The patient previously had a large right  maxillary soft tissue mass that spontaneously eroded the lateral nasal wall/medial maxillary wall.  Currently he has significant amount of crusting and mucoid drainage around the maxillary opening.  He also has a 9 mm calcified lesion within the right sphenoid sinus.  3.  Severe nasal septal deviation, causing nasal obstruction.   Plan: 1.  The nasal endoscopy findings and the CT images are extensively reviewed with the patient.  2.  Based on the above findings, the patient will benefit from undergoing surgical intervention with right maxillary antrostomy and tissue removal, right total ethmoidectomy and sphenoidectomy with tissue removal.  He will also benefit from a septoplasty to improve his nasal passageways.   3.  The risks, benefits, and details of the procedures are reviewed with the patient.  The patient would like to proceed with the procedures.

## 2018-05-18 NOTE — Discharge Instructions (Addendum)

## 2018-05-18 NOTE — Transfer of Care (Signed)
Immediate Anesthesia Transfer of Care Note  Patient: Seth Higgins  Procedure(s) Performed: ENDOSCOPIC SINUS SURGERY (Right Nose) RIGHT MAXILLARY ANTROSTOMY WITH TISSUE REMOVAL (Right Nose) SEPTOPLASTY WITH RIGHT ETHMOIDECTOMY AND SPHENOIDECTOMY WITH TISSUE REMOVAL (Right Nose)  Patient Location: PACU  Anesthesia Type:General  Level of Consciousness: awake, alert  and oriented  Airway & Oxygen Therapy: Patient Spontanous Breathing and Patient connected to face mask oxygen  Post-op Assessment: Report given to RN and Post -op Vital signs reviewed and stable  Post vital signs: Reviewed and stable  Last Vitals:  Vitals Value Taken Time  BP    Temp    Pulse    Resp    SpO2      Last Pain:  Vitals:   05/18/18 0817  TempSrc: Oral  PainSc: 0-No pain         Complications: No apparent anesthesia complications

## 2018-05-18 NOTE — Anesthesia Procedure Notes (Signed)
Procedure Name: Intubation Performed by: Verita Lamb, CRNA Pre-anesthesia Checklist: Patient identified, Emergency Drugs available, Suction available, Patient being monitored and Timeout performed Patient Re-evaluated:Patient Re-evaluated prior to induction Oxygen Delivery Method: Circle system utilized Preoxygenation: Pre-oxygenation with 100% oxygen Induction Type: IV induction Ventilation: Mask ventilation without difficulty Laryngoscope Size: Miller and 2 Grade View: Grade II Tube type: Oral Tube size: 7.0 mm Number of attempts: 1 Airway Equipment and Method: Stylet Placement Confirmation: ETT inserted through vocal cords under direct vision,  positive ETCO2,  CO2 detector and breath sounds checked- equal and bilateral Secured at: 23 cm Tube secured with: Tape Dental Injury: Teeth and Oropharynx as per pre-operative assessment

## 2018-05-18 NOTE — Anesthesia Preprocedure Evaluation (Addendum)
Anesthesia Evaluation  Patient identified by MRN, date of birth, ID band Patient awake    Reviewed: Allergy & Precautions, NPO status , Patient's Chart, lab work & pertinent test results  History of Anesthesia Complications Negative for: history of anesthetic complications  Airway Mallampati: IV  TM Distance: >3 FB Neck ROM: Full    Dental  (+) Dental Advisory Given   Pulmonary former smoker (quit 1970),    breath sounds clear to auscultation       Cardiovascular (-) hypertension(-) angina+ CAD, + Past MI and + Cardiac Stents   Rhythm:Regular Rate:Normal  '13 Cardiac catheterization: Nonobstructive CAD, patent RCA stent; EF 50-55%   Neuro/Psych negative neurological ROS     GI/Hepatic negative GI ROS, Neg liver ROS,   Endo/Other  negative endocrine ROS  Renal/GU negative Renal ROS     Musculoskeletal   Abdominal   Peds  Hematology negative hematology ROS (+)   Anesthesia Other Findings   Reproductive/Obstetrics                            Anesthesia Physical Anesthesia Plan  ASA: III  Anesthesia Plan: General   Post-op Pain Management:    Induction: Intravenous  PONV Risk Score and Plan: 2 and Ondansetron and Dexamethasone  Airway Management Planned: Oral ETT and Video Laryngoscope Planned  Additional Equipment:   Intra-op Plan:   Post-operative Plan: Extubation in OR  Informed Consent: I have reviewed the patients History and Physical, chart, labs and discussed the procedure including the risks, benefits and alternatives for the proposed anesthesia with the patient or authorized representative who has indicated his/her understanding and acceptance.   Dental advisory given  Plan Discussed with: CRNA and Surgeon  Anesthesia Plan Comments: (Plan routine monitors, GETA)       Anesthesia Quick Evaluation

## 2018-05-19 ENCOUNTER — Encounter (HOSPITAL_BASED_OUTPATIENT_CLINIC_OR_DEPARTMENT_OTHER): Payer: Self-pay | Admitting: Otolaryngology

## 2018-05-19 NOTE — Anesthesia Postprocedure Evaluation (Signed)
Anesthesia Post Note  Patient: Seth Higgins  Procedure(s) Performed: ENDOSCOPIC SINUS SURGERY (Right Nose) RIGHT MAXILLARY ANTROSTOMY WITH TISSUE REMOVAL (Right Nose) SEPTOPLASTY WITH RIGHT ETHMOIDECTOMY AND SPHENOIDECTOMY WITH TISSUE REMOVAL (Right Nose) NASAL SEPTOPLASTY WITH TURBINATE REDUCTION ([name] Nose)     Patient location during evaluation: PACU Anesthesia Type: General Level of consciousness: awake and alert Pain management: pain level controlled Vital Signs Assessment: post-procedure vital signs reviewed and stable Respiratory status: spontaneous breathing, nonlabored ventilation, respiratory function stable and patient connected to nasal cannula oxygen Cardiovascular status: blood pressure returned to baseline and stable Postop Assessment: no apparent nausea or vomiting Anesthetic complications: no    Last Vitals:  Vitals:   05/18/18 1245 05/18/18 1315  BP: (!) 125/56 125/66  Pulse: 63 63  Resp: 13 16  Temp:  36.7 C  SpO2: 94% 95%    Last Pain:  Vitals:   05/18/18 1315  TempSrc: Oral  PainSc: [place]

## 2018-05-21 ENCOUNTER — Ambulatory Visit (INDEPENDENT_AMBULATORY_CARE_PROVIDER_SITE_OTHER): Payer: Medicare HMO | Admitting: Otolaryngology

## 2018-05-21 DIAGNOSIS — J338 Other polyp of sinus: Secondary | ICD-10-CM

## 2018-05-21 DIAGNOSIS — J322 Chronic ethmoidal sinusitis: Secondary | ICD-10-CM | POA: Diagnosis not present

## 2018-05-21 DIAGNOSIS — J320 Chronic maxillary sinusitis: Secondary | ICD-10-CM

## 2018-05-21 DIAGNOSIS — J323 Chronic sphenoidal sinusitis: Secondary | ICD-10-CM | POA: Diagnosis not present

## 2018-05-26 ENCOUNTER — Encounter (HOSPITAL_BASED_OUTPATIENT_CLINIC_OR_DEPARTMENT_OTHER): Payer: Self-pay | Admitting: Otolaryngology

## 2018-06-04 ENCOUNTER — Ambulatory Visit (INDEPENDENT_AMBULATORY_CARE_PROVIDER_SITE_OTHER): Payer: Medicare HMO | Admitting: Otolaryngology

## 2018-06-04 DIAGNOSIS — J323 Chronic sphenoidal sinusitis: Secondary | ICD-10-CM | POA: Diagnosis not present

## 2018-06-04 DIAGNOSIS — J322 Chronic ethmoidal sinusitis: Secondary | ICD-10-CM

## 2018-06-04 DIAGNOSIS — J320 Chronic maxillary sinusitis: Secondary | ICD-10-CM

## 2018-06-18 DIAGNOSIS — Z0001 Encounter for general adult medical examination with abnormal findings: Secondary | ICD-10-CM | POA: Diagnosis not present

## 2018-06-18 DIAGNOSIS — Z6823 Body mass index (BMI) 23.0-23.9, adult: Secondary | ICD-10-CM | POA: Diagnosis not present

## 2018-06-18 DIAGNOSIS — R739 Hyperglycemia, unspecified: Secondary | ICD-10-CM | POA: Diagnosis not present

## 2018-06-18 DIAGNOSIS — E782 Mixed hyperlipidemia: Secondary | ICD-10-CM | POA: Diagnosis not present

## 2018-06-25 ENCOUNTER — Ambulatory Visit (INDEPENDENT_AMBULATORY_CARE_PROVIDER_SITE_OTHER): Payer: Medicare HMO | Admitting: Otolaryngology

## 2018-06-25 DIAGNOSIS — J323 Chronic sphenoidal sinusitis: Secondary | ICD-10-CM | POA: Diagnosis not present

## 2018-06-25 DIAGNOSIS — J320 Chronic maxillary sinusitis: Secondary | ICD-10-CM | POA: Diagnosis not present

## 2018-06-25 DIAGNOSIS — J322 Chronic ethmoidal sinusitis: Secondary | ICD-10-CM

## 2018-07-20 DIAGNOSIS — L570 Actinic keratosis: Secondary | ICD-10-CM | POA: Diagnosis not present

## 2018-08-03 ENCOUNTER — Ambulatory Visit: Payer: Medicare HMO | Admitting: Adult Health

## 2018-08-06 ENCOUNTER — Encounter: Payer: Self-pay | Admitting: Neurology

## 2018-08-06 ENCOUNTER — Ambulatory Visit: Payer: Medicare HMO | Admitting: Neurology

## 2018-08-06 ENCOUNTER — Other Ambulatory Visit: Payer: Self-pay

## 2018-08-06 VITALS — BP 125/67 | HR 63 | Ht 73.0 in | Wt 180.5 lb

## 2018-08-06 DIAGNOSIS — B0229 Other postherpetic nervous system involvement: Secondary | ICD-10-CM

## 2018-08-06 MED ORDER — AMITRIPTYLINE HCL 10 MG PO TABS: 20.0000 mg | ORAL_TABLET | Freq: Every day | ORAL | 3 refills | Status: DC

## 2018-08-06 NOTE — Progress Notes (Signed)
Reason for visit: Postherpetic neuralgia  Seth Higgins is an 78 y.o. male  History of present illness:  Seth Higgins is a 78 year old right-handed white male with a history of a left C2-3 postherpetic neuralgia.  The patient has had reduction of pain but not cessation of pain with use of amitriptyline and gabapentin.  The patient is on 400 mg twice daily of gabapentin and takes 20 mg at night of the amitriptyline.  The patient tolerates medications very well.  He still has significant hypersensitivity to light touch on the left upper shoulder, but he is able to sleep fairly well at night.  He has recently had sinus surgery which is helped his apneic episodes at night.  The patient returns to the office today for an evaluation.  He does use topical lidocaine cream on the shoulder at times.  Past Medical History:  Diagnosis Date   CAD (coronary artery disease)    NSTEMI/DES RCA, 05/11. EF 50%,inferoapical HK   Cancer ([place]) 1999   Colon Cancer   Dyslipidemia    History of colon cancer    pre-cancer   Post herpetic neuralgia 10/17/2016   Left C2-3    Past Surgical History:  Procedure Laterality Date   CARDIAC CATHETERIZATION  09/2009   with stent placement   COLON SURGERY  1999   COLONOSCOPY [name] 07/01/2012   Procedure: COLONOSCOPY;  Surgeon: Rogene Houston, MD;  [facility];  Service: Endoscopy;  Laterality: [name];  830   COLONOSCOPY [name] 12/10/2017   Procedure: COLONOSCOPY;  Surgeon: Rogene Houston, MD;  [facility];  Service: Endoscopy;  Laterality: [name];  [place]     LEFT   MAXILLARY ANTROSTOMY Right 05/18/2018   Procedure: RIGHT MAXILLARY ANTROSTOMY WITH TISSUE REMOVAL;  Surgeon: Leta Baptist, MD;  Location: [place];  Service: ENT;  Laterality: Right;   NASAL SEPTOPLASTY W/ TURBINOPLASTY [name] 05/18/2018   Procedure: NASAL SEPTOPLASTY WITH TURBINATE REDUCTION;  Surgeon: Leta Baptist, MD;  Location: [place];   Service: ENT;  Laterality: [name];   POLYPECTOMY  12/10/2017   Procedure: POLYPECTOMY;  Surgeon: Rogene Houston, MD;  [facility];  Service: Endoscopy;;  colon   SEPTOPLASTY WITH ETHMOIDECTOMY, AND MAXILLARY ANTROSTOMY Right 05/18/2018   Procedure: SEPTOPLASTY WITH RIGHT ETHMOIDECTOMY AND SPHENOIDECTOMY WITH TISSUE REMOVAL;  Surgeon: Leta Baptist, MD;  Location: [place];  Service: ENT;  Laterality: Right;   SINUS ENDO W/FUSION Right 05/18/2018   Procedure: ENDOSCOPIC SINUS SURGERY WITH NAVIGATION;  Surgeon: Leta Baptist, MD;  Location: [place];  Service: ENT;  Laterality: Right;    Family History  Problem Relation Age of Onset   Coronary artery disease Father        had CABG in 1970   Colon cancer Father     Social history:  reports that he quit smoking about 50 years ago. His smoking use included cigarettes. He has a 10.00 pack-year smoking history. He has never used smokeless tobacco. He reports that he does not drink alcohol or use drugs.   No Known Allergies  Medications:  Prior to Admission medications   Medication Sig Start Date End Date Taking? Authorizing Provider  amitriptyline (ELAVIL) 10 MG tablet Take 2 tablets (20 mg total) by mouth at bedtime. 07/31/17  Yes Ward Givens, NP  gabapentin (NEURONTIN) 400 MG capsule Take 400 mg by mouth 2 (two) times daily. 07/31/18  Yes [provider]  Lidocaine 4 % LOTN Apply 1 g topically 3 (three) times daily as needed. Patient taking differently: Apply 1 g topically 3 (three) times daily as needed (pain from shingles on neck).  10/17/16  Yes Kathrynn Ducking, MD  loratadine (CLARITIN) 10 MG tablet Take 10 mg by mouth daily.   Yes [provider]  Multiple Vitamins-Minerals (MULTIVITAMIN ADULTS PO) Take 1 tablet by mouth daily.   Yes [provider]  nitroGLYCERIN (NITROSTAT) 0.4 MG SL tablet Place 1 tablet (0.4 mg total) under the tongue every 5 (five) minutes as  needed. 03/20/12  Yes Serpe, Burna Forts, PA-C  rosuvastatin (CRESTOR) 40 MG tablet Take 10 mg by mouth at bedtime.  09/07/17  Yes [provider]    ROS:  Out of a complete 14 system review of symptoms, the patient complains only of the following symptoms, and all other reviewed systems are negative.  Left shoulder pain  Blood pressure 125/67, pulse 63, height 6\' 1"  (1.854 m), weight 180 lb 8 oz (81.9 kg).  Physical Exam  General: The patient is alert and cooperative at the time of the examination.  Skin: No significant peripheral edema is noted.   Neurologic Exam  Mental status: The patient is alert and oriented x 3 at the time of the examination. The patient has apparent normal recent and remote memory, with an apparently normal attention span and concentration ability.   Cranial nerves: Facial symmetry is present. Speech is normal, no aphasia or dysarthria is noted. Extraocular movements are full. Visual fields are full.  Motor: The patient has good strength in all 4 extremities.  Sensory examination: Soft touch sensation is symmetric on the face, arms, and legs.  Coordination: The patient has good finger-nose-finger and heel-to-shin bilaterally.  Gait and station: The patient has a normal gait. Tandem gait is unsteady. Romberg is negative. No drift is seen.  Reflexes: Deep tendon reflexes are symmetric.   Assessment/Plan:  1.  Postherpetic neuralgia, left C2-3  The patient wishes to consider a possible rhizotomy procedure for the postherpetic neuralgia.  I will get a referral set up to the [facility] for this purpose.  The patient will be given a prescription for amitriptyline, he will follow-up here in 1 year.  Jill Alexanders MD 08/06/2018 12:23 PM  [facility] [address] [place], [place] [address]  Phone [phone] Fax [phone]

## 2018-09-12 DIAGNOSIS — R300 Dysuria: Secondary | ICD-10-CM | POA: Diagnosis not present

## 2018-09-12 DIAGNOSIS — R509 Fever, unspecified: Secondary | ICD-10-CM | POA: Diagnosis not present

## 2018-09-15 DIAGNOSIS — D72829 Elevated white blood cell count, unspecified: Secondary | ICD-10-CM | POA: Diagnosis not present

## 2018-10-26 ENCOUNTER — Ambulatory Visit (INDEPENDENT_AMBULATORY_CARE_PROVIDER_SITE_OTHER): Payer: Medicare HMO | Admitting: Otolaryngology

## 2018-10-26 DIAGNOSIS — J310 Chronic rhinitis: Secondary | ICD-10-CM

## 2018-10-26 DIAGNOSIS — J320 Chronic maxillary sinusitis: Secondary | ICD-10-CM

## 2018-11-17 DIAGNOSIS — Z85828 Personal history of other malignant neoplasm of skin: Secondary | ICD-10-CM | POA: Diagnosis not present

## 2018-11-17 DIAGNOSIS — L570 Actinic keratosis: Secondary | ICD-10-CM | POA: Diagnosis not present

## 2018-12-10 DIAGNOSIS — E8881 Metabolic syndrome: Secondary | ICD-10-CM | POA: Diagnosis not present

## 2018-12-10 DIAGNOSIS — E782 Mixed hyperlipidemia: Secondary | ICD-10-CM | POA: Diagnosis not present

## 2018-12-10 DIAGNOSIS — D72829 Elevated white blood cell count, unspecified: Secondary | ICD-10-CM | POA: Diagnosis not present

## 2018-12-10 DIAGNOSIS — R739 Hyperglycemia, unspecified: Secondary | ICD-10-CM | POA: Diagnosis not present

## 2018-12-10 DIAGNOSIS — R7301 Impaired fasting glucose: Secondary | ICD-10-CM | POA: Diagnosis not present

## 2018-12-15 DIAGNOSIS — I2510 Atherosclerotic heart disease of native coronary artery without angina pectoris: Secondary | ICD-10-CM | POA: Diagnosis not present

## 2018-12-15 DIAGNOSIS — R739 Hyperglycemia, unspecified: Secondary | ICD-10-CM | POA: Diagnosis not present

## 2018-12-15 DIAGNOSIS — B0223 Postherpetic polyneuropathy: Secondary | ICD-10-CM | POA: Diagnosis not present

## 2018-12-15 DIAGNOSIS — E782 Mixed hyperlipidemia: Secondary | ICD-10-CM | POA: Diagnosis not present

## 2018-12-15 DIAGNOSIS — Z23 Encounter for immunization: Secondary | ICD-10-CM | POA: Diagnosis not present

## 2018-12-15 DIAGNOSIS — Z6823 Body mass index (BMI) 23.0-23.9, adult: Secondary | ICD-10-CM | POA: Diagnosis not present

## 2019-02-18 ENCOUNTER — Other Ambulatory Visit: Payer: Self-pay | Admitting: Adult Health

## 2019-02-18 DIAGNOSIS — Z23 Encounter for immunization: Secondary | ICD-10-CM | POA: Diagnosis not present

## 2019-04-27 DIAGNOSIS — L570 Actinic keratosis: Secondary | ICD-10-CM | POA: Diagnosis not present

## 2019-06-14 DIAGNOSIS — R7301 Impaired fasting glucose: Secondary | ICD-10-CM | POA: Diagnosis not present

## 2019-06-14 DIAGNOSIS — E8881 Metabolic syndrome: Secondary | ICD-10-CM | POA: Diagnosis not present

## 2019-06-14 DIAGNOSIS — E782 Mixed hyperlipidemia: Secondary | ICD-10-CM | POA: Diagnosis not present

## 2019-06-14 DIAGNOSIS — E7800 Pure hypercholesterolemia, unspecified: Secondary | ICD-10-CM | POA: Diagnosis not present

## 2019-06-17 DIAGNOSIS — B0223 Postherpetic polyneuropathy: Secondary | ICD-10-CM | POA: Diagnosis not present

## 2019-06-17 DIAGNOSIS — Z0001 Encounter for general adult medical examination with abnormal findings: Secondary | ICD-10-CM | POA: Diagnosis not present

## 2019-06-17 DIAGNOSIS — I2510 Atherosclerotic heart disease of native coronary artery without angina pectoris: Secondary | ICD-10-CM | POA: Diagnosis not present

## 2019-06-17 DIAGNOSIS — R739 Hyperglycemia, unspecified: Secondary | ICD-10-CM | POA: Diagnosis not present

## 2019-06-17 DIAGNOSIS — Z6823 Body mass index (BMI) 23.0-23.9, adult: Secondary | ICD-10-CM | POA: Diagnosis not present

## 2019-06-17 DIAGNOSIS — E782 Mixed hyperlipidemia: Secondary | ICD-10-CM | POA: Diagnosis not present

## 2019-08-05 NOTE — Progress Notes (Signed)
PATIENT: Seth Higgins DOB: July 02, 1940  REASON FOR VISIT: follow up HISTORY FROM: patient  HISTORY OF PRESENT ILLNESS: Today 08/09/19  Seth Higgins is a 79 year old male with history of left C2-3 postherpetic neuralgia since 2016.  He is taking amitriptyline and gabapentin.  When last seen he was referred to the [facility] for possible rhizotomy, but did not follow-up because his wife would not be able to accompany him.  He has reported increase in pain in the left side occipital area, to his jaw, neck, occasionally left shoulder.  He is hypersensitive to touch in this area.  It feels like a sunburn.  He applies lidocaine cream at night without much benefit.  The summer heat makes it worse.  He sees his PCP, reports blood work has been normal.  He remains active, does remodeling work.  Tolerating medications without side effect.  He presents today accompanied by his wife.  HISTORY 08/06/2018 Dr. Jannifer Franklin: Seth Higgins is a 79 year old right-handed white male with a history of a left C2-3 postherpetic neuralgia.  The patient has had reduction of pain but not cessation of pain with use of amitriptyline and gabapentin.  The patient is on 400 mg twice daily of gabapentin and takes 20 mg at night of the amitriptyline.  The patient tolerates medications very well.  He still has significant hypersensitivity to light touch on the left upper shoulder, but he is able to sleep fairly well at night.  He has recently had sinus surgery which is helped his apneic episodes at night.  The patient returns to the office today for an evaluation.  He does use topical lidocaine cream on the shoulder at times.  REVIEW OF SYSTEMS: Out of a complete 14 system review of symptoms, the patient complains only of the following symptoms, and all other reviewed systems are negative.  Left shoulder pain  ALLERGIES: No Known Allergies  HOME MEDICATIONS: Outpatient Medications Prior to Visit  Medication Sig Dispense  Refill  . Lidocaine 4 % LOTN Apply 1 g topically 3 (three) times daily as needed. (Patient taking differently: Apply 1 g topically 3 (three) times daily as needed (pain from shingles on neck). ) 1 Bottle 5  . loratadine (CLARITIN) 10 MG tablet Take 10 mg by mouth daily.    . Multiple Vitamins-Minerals (MULTIVITAMIN ADULTS PO) Take 1 tablet by mouth daily.    . nitroGLYCERIN (NITROSTAT) 0.4 MG SL tablet Place 1 tablet (0.4 mg total) under the tongue every 5 (five) minutes as needed. 25 tablet 3  . rosuvastatin (CRESTOR) 40 MG tablet Take 10 mg by mouth at bedtime.   4  . amitriptyline (ELAVIL) 10 MG tablet Take 2 tablets (20 mg total) by mouth at bedtime. 180 tablet 3  . gabapentin (NEURONTIN) 400 MG capsule Take 1 capsule by mouth twice daily 180 capsule 3   No facility-administered medications prior to visit.    PAST MEDICAL HISTORY: Past Medical History:  Diagnosis Date  . CAD (coronary artery disease)    NSTEMI/DES RCA, 05/11. EF 50%,inferoapical HK  . Cancer [facility]) 1999   Colon Cancer  . Dyslipidemia   . History of colon cancer    pre-cancer  . Post herpetic neuralgia 10/17/2016   Left C2-3    PAST SURGICAL HISTORY: Past Surgical History:  Procedure Laterality Date  . CARDIAC CATHETERIZATION  09/2009   with stent placement  . COLON SURGERY  1999  . COLONOSCOPY [name] 07/01/2012   Procedure: COLONOSCOPY;  Surgeon: Mechele Dawley  Laural Golden, MD;  [facility];  Service: Endoscopy;  Laterality: [name];  830  . COLONOSCOPY [name] 12/10/2017   Procedure: COLONOSCOPY;  Surgeon: Rogene Houston, MD;  [facility];  Service: Endoscopy;  Laterality: [name];  830  . HERNIA REPAIR     LEFT  . MAXILLARY ANTROSTOMY Right 05/18/2018   Procedure: RIGHT MAXILLARY ANTROSTOMY WITH TISSUE REMOVAL;  Surgeon: Leta Baptist, MD;  Location: [place];  Service: ENT;  Laterality: Right;  . NASAL SEPTOPLASTY W/ TURBINOPLASTY [name] 05/18/2018   Procedure: NASAL SEPTOPLASTY WITH TURBINATE  REDUCTION;  Surgeon: Leta Baptist, MD;  Location: [place];  Service: ENT;  Laterality: [name];  . POLYPECTOMY  12/10/2017   Procedure: POLYPECTOMY;  Surgeon: Rogene Houston, MD;  [facility];  Service: Endoscopy;;  colon  . SEPTOPLASTY WITH ETHMOIDECTOMY, AND MAXILLARY ANTROSTOMY Right 05/18/2018   Procedure: SEPTOPLASTY WITH RIGHT ETHMOIDECTOMY AND SPHENOIDECTOMY WITH TISSUE REMOVAL;  Surgeon: Leta Baptist, MD;  Location: [place];  Service: ENT;  Laterality: Right;  . SINUS ENDO W/FUSION Right 05/18/2018   Procedure: ENDOSCOPIC SINUS SURGERY WITH NAVIGATION;  Surgeon: Leta Baptist, MD;  Location: [place];  Service: ENT;  Laterality: Right;    FAMILY HISTORY: Family History  Problem Relation Age of Onset  . Coronary artery disease Father        had CABG in 1970  . Colon cancer Father     SOCIAL HISTORY: Social History   Socioeconomic History  . Marital status: Married    Spouse name: Seth Higgins  . Number of children: 0  . Years of education: 75  . Highest education level: Not on file  Occupational History  . Not on file  Tobacco Use  . Smoking status: Former Smoker    Packs/day: 1.00    Years: 10.00    Pack years: 10.00    Types: Cigarettes    Quit date: 05/20/1968    Years since quitting: 51.2  . Smokeless tobacco: Never Used  Substance and Sexual Activity  . Alcohol use: No  . Drug use: No  . Sexual activity: Not on file  Other Topics Concern  . Not on file  Social History Narrative   Lives with wife   Caffeine use: Very little per day (1 cup per day)   Right handed   Pt donates blood every 2 months   works out at the [employer] and walks   Social Determinants of Radio broadcast assistant Strain:   . Difficulty of Paying Living Expenses:   Food Insecurity:   . Worried About Charity fundraiser in the Last Year:   . Arboriculturist in the Last Year:   Transportation Needs:   . Film/video editor (Medical):   Marland Kitchen  Lack of Transportation (Non-Medical):   Physical Activity:   . Days of Exercise per Week:   . Minutes of Exercise per Session:   Stress:   . Feeling of Stress :   Social Connections:   . Frequency of Communication with Friends and Family:   . Frequency of Social Gatherings with Friends and Family:   . Attends Religious Services:   . Active Member of Clubs or Organizations:   . Attends Archivist Meetings:   Marland Kitchen Marital Status:   Intimate Partner Violence:   . Fear of Current or Ex-Partner:   . Emotionally Abused:   Marland Kitchen Physically Abused:   . Sexually Abused:  PHYSICAL EXAM  Vitals:   08/09/19 1127  BP: 114/66  Pulse: 69  Temp: (!) 97.5 F (36.4 C)  Weight: 183 lb (83 kg)   Body mass index is 24.14 kg/m.  Generalized: Well developed, in no acute distress   Neurological examination  Mentation: Alert oriented to time, place, history taking. Follows all commands speech and language fluent Cranial nerve II-XII: Pupils were equal round reactive to light. Extraocular movements were full, visual field were full on confrontational test. Facial sensation and strength were normal.  Head turning and shoulder shrug were normal and symmetric. Motor: The motor testing reveals 5 over 5 strength of all 4 extremities. Good symmetric motor tone is noted throughout.  Sensory: Sensory testing is intact to soft touch on all 4 extremities. No evidence of extinction is noted.  Sensory testing to his face is symmetric to soft touch. Coordination: Cerebellar testing reveals good finger-nose-finger and heel-to-shin bilaterally.  Gait and station: Gait is normal. Tandem gait is mildly unsteady, no assistive device. Reflexes: Deep tendon reflexes are symmetric    DIAGNOSTIC DATA (LABS, IMAGING, TESTING) - I reviewed patient records, labs, notes, testing and imaging myself where available.  Lab Results  Component Value Date   WBC 8.1 09/26/2009   HGB 15.2 09/26/2009   HCT 43.3 09/26/2009    MCV 94.3 09/26/2009   PLT 212 09/26/2009      Component Value Date/Time   NA 140 09/26/2009 0417   K 4.0 09/26/2009 0417   CL 109 09/26/2009 0417   CO2 24 09/26/2009 0417   GLUCOSE 101 (H) 09/26/2009 0417   BUN 10 09/26/2009 0417   CREATININE 0.98 09/26/2009 0417   CALCIUM 8.7 09/26/2009 0417   GFRNONAA >60 09/26/2009 0417   GFRAA  09/26/2009 0417    >60        The eGFR has been calculated using the MDRD equation. This calculation has not been validated in all clinical situations. eGFR's persistently <60 mL/min signify possible Chronic Kidney Disease.   Lab Results  Component Value Date   CHOL  09/25/2009    150        ATP III CLASSIFICATION:  <200     mg/dL   Desirable  200-239  mg/dL   Borderline High  >=240    mg/dL   High          HDL 32 (L) 09/25/2009   LDLCALC (H) 09/25/2009    104        Total Cholesterol/HDL:CHD Risk Coronary Heart Disease Risk Table                     Men   Women  1/2 Average Risk   3.4   3.3  Average Risk       5.0   4.4  2 X Average Risk   9.6   7.1  3 X Average Risk  23.4   11.0        Use the calculated Patient Ratio above and the CHD Risk Table to determine the patient's CHD Risk.        ATP III CLASSIFICATION (LDL):  <100     mg/dL   Optimal  100-129  mg/dL   Near or Above                    Optimal  130-159  mg/dL   Borderline  160-189  mg/dL   High  >190     mg/dL   Very High  TRIG 70 09/25/2009   CHOLHDL 4.7 09/25/2009   Lab Results  Component Value Date   HGBA1C  09/25/2009    5.2 (NOTE)                                                                       According to the ADA Clinical Practice Recommendations for 2011, when HbA1c is used as a screening test:   >=6.5%   Diagnostic of Diabetes Mellitus           (if abnormal result  is confirmed)  5.7-6.4%   Increased risk of developing Diabetes Mellitus  References:Diagnosis and Classification of Diabetes Mellitus,Diabetes EOFH,2197,58(ITGPQ 1):S62-S69 and  Standards of Medical Care in         Diabetes - 2011,Diabetes Care,2011,34  (Suppl 1):S11-S61.   No results found for: VITAMINB12 No results found for: TSH    ASSESSMENT AND PLAN 79 y.o. year old male  has a past medical history of CAD (coronary artery disease), Cancer ([place]) (1999), Dyslipidemia, History of colon cancer, and Post herpetic neuralgia (10/17/2016). here with:  1.  Postherpetic neuralgia, left C2-3  He has had an increase in his pain.  I will increase gabapentin to 600 mg twice a day.  We can continue to push the dose up if needed.  He will remain on amitriptyline.  We will hold off on another referral for the [facility] for possible rhizotomy procedure at this time.  He will call for dose adjustment, otherwise follow-up 1 year or sooner if needed.  I spent 20 minutes of face-to-face and non-face-to-face time with patient. This included previsit chart review, lab review, study review, order entry, electronic health record documentation, patient education.  Butler Denmark, AGNP-C, DNP 08/09/2019, 12:04 PM [facility] [address], [place], [place] [address] [phone]

## 2019-08-09 ENCOUNTER — Ambulatory Visit: Payer: Medicare HMO | Admitting: Neurology

## 2019-08-09 ENCOUNTER — Encounter: Payer: Self-pay | Admitting: Neurology

## 2019-08-09 ENCOUNTER — Other Ambulatory Visit: Payer: Self-pay

## 2019-08-09 VITALS — BP 114/66 | HR 69 | Temp 97.5°F | Wt 183.0 lb

## 2019-08-09 DIAGNOSIS — B0229 Other postherpetic nervous system involvement: Secondary | ICD-10-CM

## 2019-08-09 MED ORDER — AMITRIPTYLINE HCL 10 MG PO TABS: 20.0000 mg | ORAL_TABLET | Freq: Every day | ORAL | 3 refills | Status: DC

## 2019-08-09 MED ORDER — GABAPENTIN 600 MG PO TABS: 600.0000 mg | ORAL_TABLET | Freq: Two times a day (BID) | ORAL | 11 refills | Status: DC

## 2019-08-09 NOTE — Progress Notes (Signed)
I have read the note, and I agree with the clinical assessment and plan.  Xan Ingraham K Jetta Murray   

## 2019-08-09 NOTE — Patient Instructions (Signed)
Increase the gabapentin 600 mg twice daily  Continue amitriptyline 20 mg mg at bedtime  See you back in 1 year

## 2019-09-09 DIAGNOSIS — R300 Dysuria: Secondary | ICD-10-CM | POA: Diagnosis not present

## 2019-09-13 ENCOUNTER — Telehealth: Payer: Self-pay | Admitting: Neurology

## 2019-09-13 MED ORDER — GABAPENTIN 600 MG PO TABS: 600.0000 mg | ORAL_TABLET | Freq: Three times a day (TID) | ORAL | 11 refills | Status: DC

## 2019-09-13 NOTE — Telephone Encounter (Signed)
Phone rep checked office voicemail's,at 10:29 this morning wife(Manzo,Linda on DPR) left voicemail stating the increase in the gabapentin has not made a difference at all, wife is asking for a call to discuss what will be done now.

## 2019-09-13 NOTE — Addendum Note (Signed)
Addended by: Suzzanne Cloud on: 09/13/2019 04:24 PM   Modules accepted: Orders

## 2019-09-13 NOTE — Telephone Encounter (Signed)
I called the patient. Has had more pain, not much change with gabapentin 600 mg twice a day. He doesn't feel like it helps him much at all, we discussed options, he will go up to taking gabapentin 600 mg 3 times a day.  If this is not helpful in the next couple weeks, we will consider switch to Lyrica.

## 2019-10-27 DIAGNOSIS — L570 Actinic keratosis: Secondary | ICD-10-CM | POA: Diagnosis not present

## 2019-10-31 IMAGING — CT CT MAXILLOFACIAL W/O CM
3 series · 12 of 47 positions shown, 14 images · non-contrast
Comparison: Head CT without contrast 09/26/2014.

CLINICAL DATA: 77-year-old male with right side nostril congestion
and foul smell for 6 months.

EXAM:
CT MAXILLOFACIAL WITHOUT CONTRAST
TECHNIQUE: Multidetector CT images of the paranasal sinuses were obtained using
the standard protocol without intravenous contrast.

[Series 2: standard · axial · 0.38mm/px · z∈[+1310,+1402]mm · 6 of 117 slices shown, 8 images]
[im 13/117  brain]
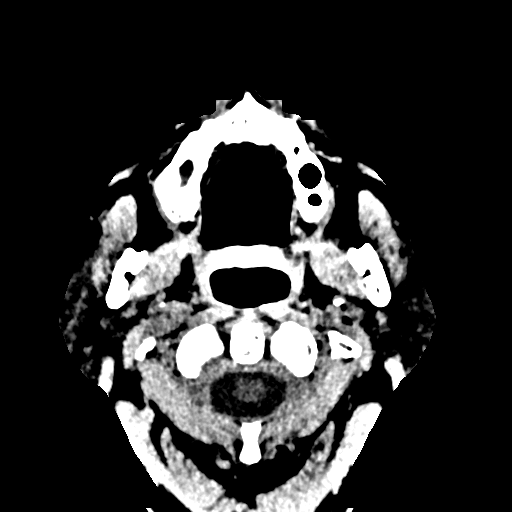
[im 13/117  bone]
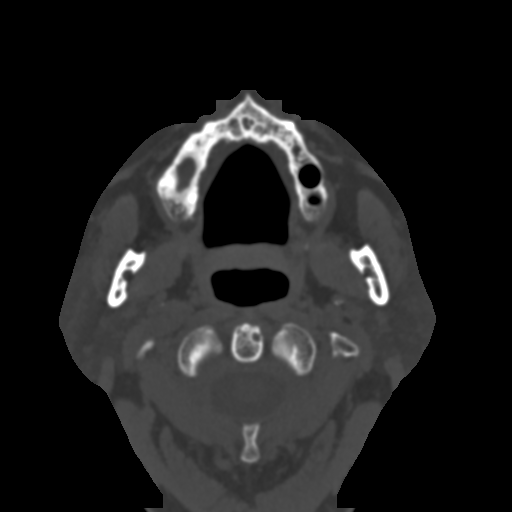
[im 33/117  bone]
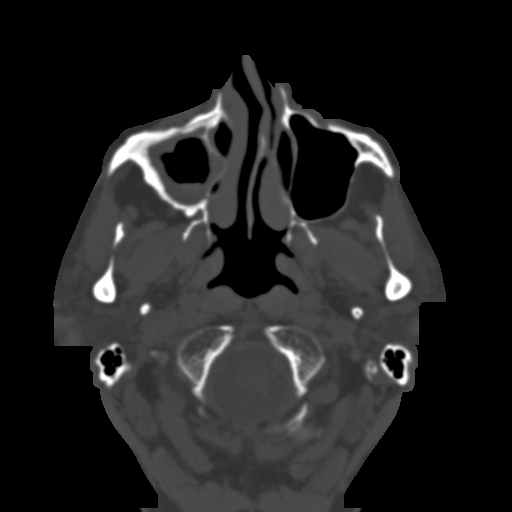
[im 49/117  bone]
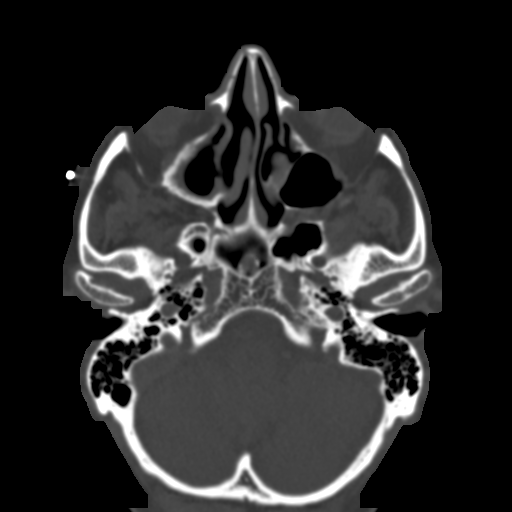
[im 69/117  bone]
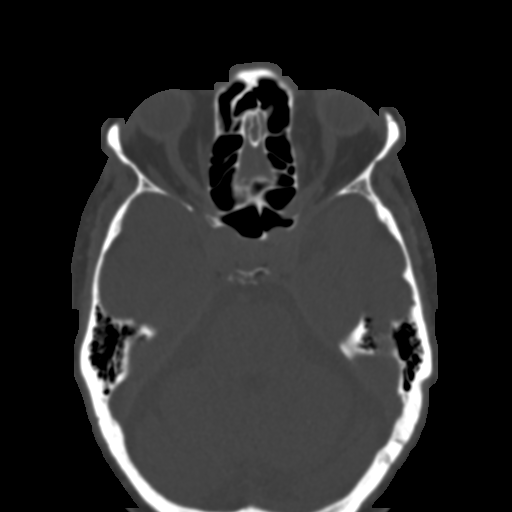
[im 89/117  brain]
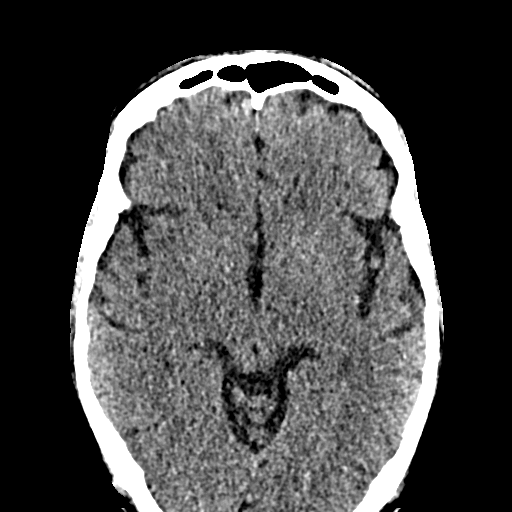
[im 89/117  bone]
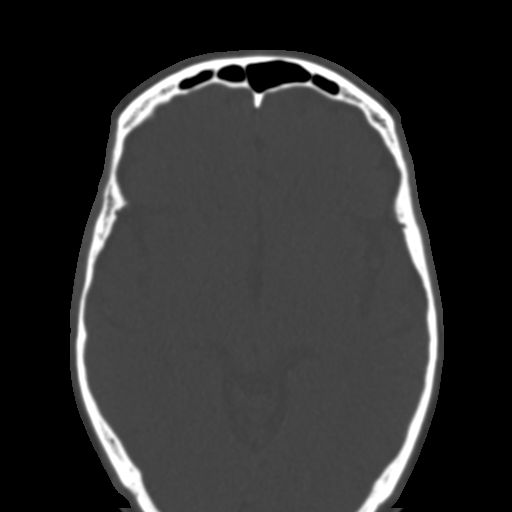
[im 105/117  bone]
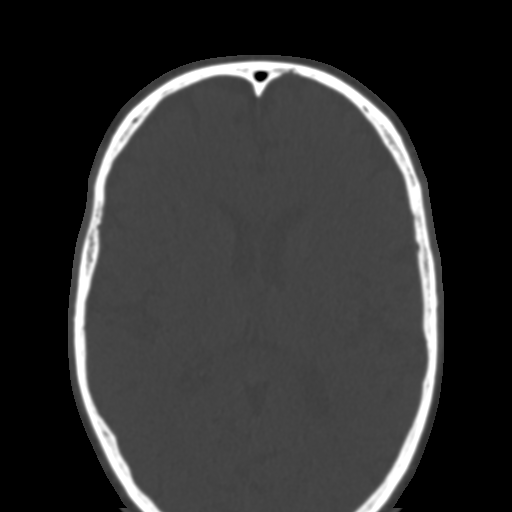

[Series 4: coronal · coronal · 0.26mm/px · 3 of 100 slices shown]
[im 34/100  bone]
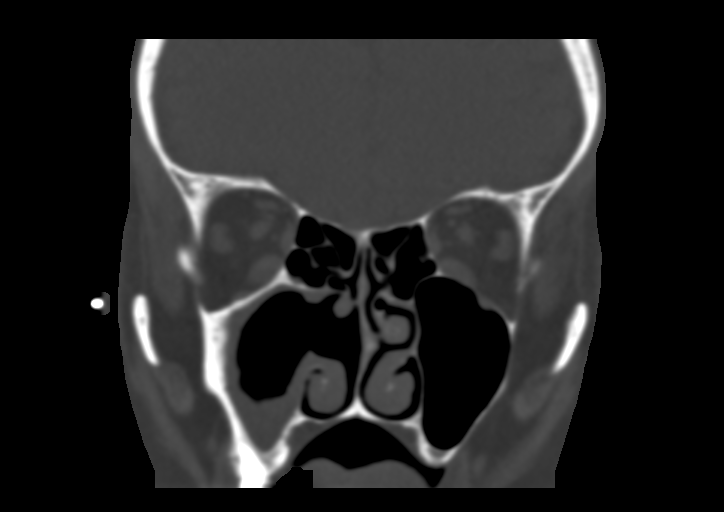
[im 45/100  bone]
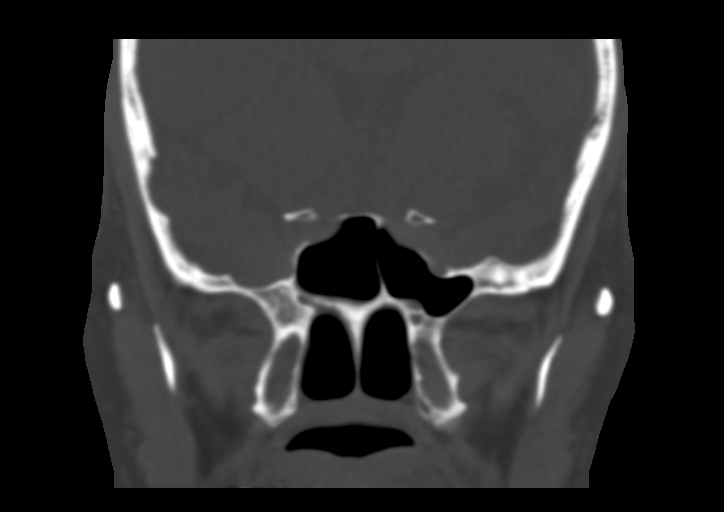
[im 56/100  bone]
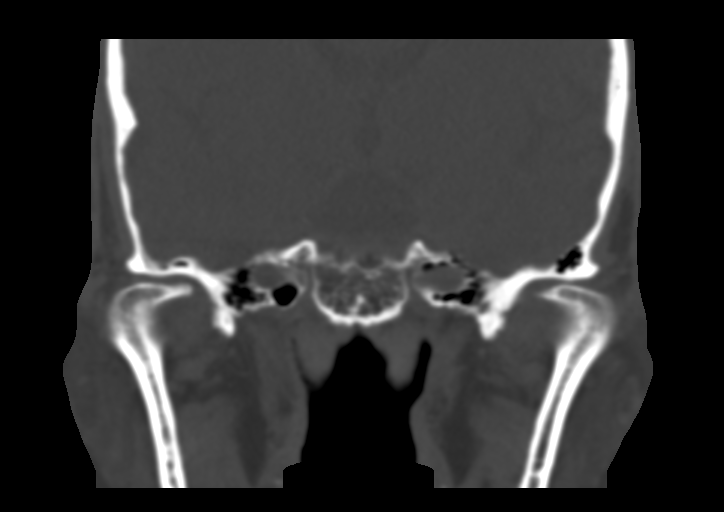

[Series 5: sagittal · sagittal · 0.25mm/px · 3 of 100 slices shown]
[im 34/100  bone]
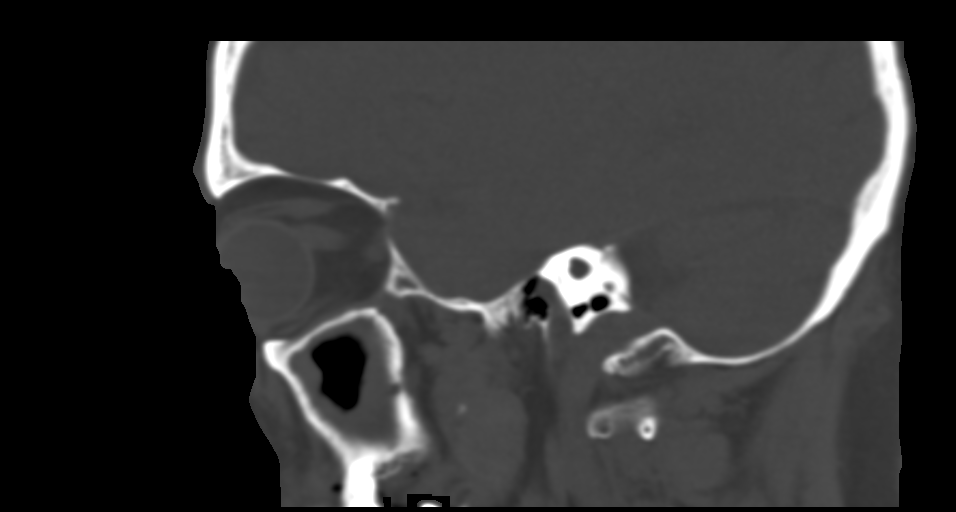
[im 50/100  bone]
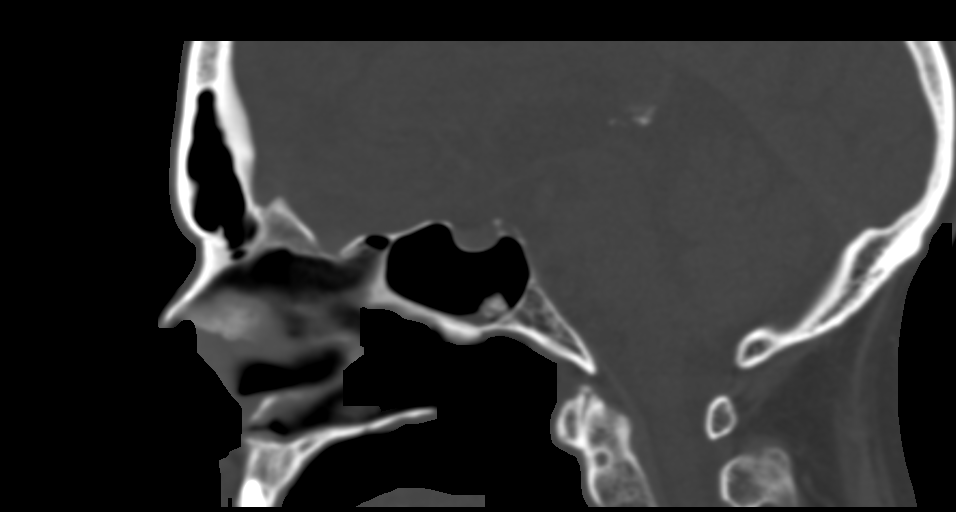
[im 67/100  bone]
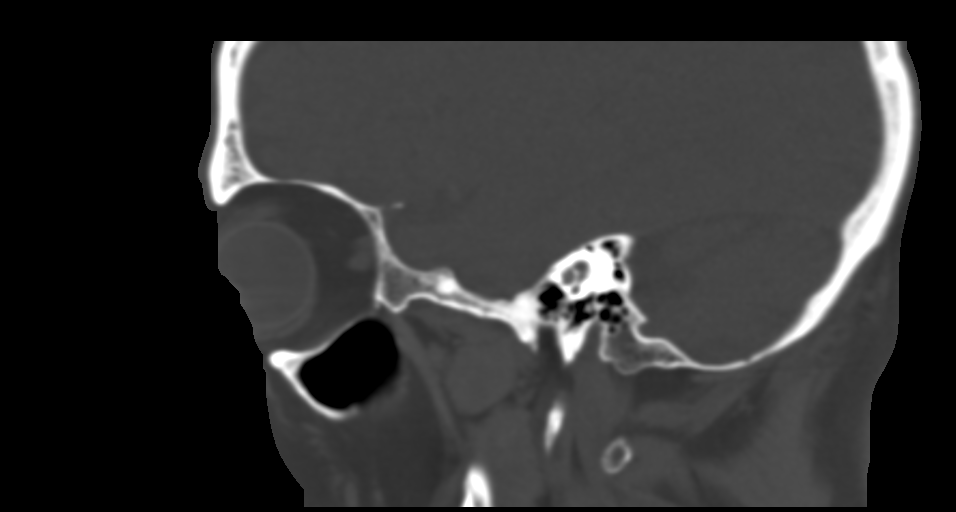

[12 of 47 positions shown; findings below may reference images not displayed]

FINDINGS: Paranasal sinuses:

Frontal: Normally aerated. Patent frontal sinus drainage pathways.

Ethmoid: Normally aerated.

Maxillary: The left maxillary sinus remains clear.

Right maxillary antrostomy for interval treated right maxillary
sinus mucocele, with moderate residual circumferential
mucoperiosteal thickening (coronal image 33), partially continuing
into the right nasal cavity.

Sphenoid: New mild mucosal thickening along the floor of the right
sphenoid sinus plus a new since 8394 densely calcified loose body
within the sinus (coronal image 54 and series 3, image 51).

Right ostiomeatal unit: Patent status post maxillary antrostomy but
with residual mucoperiosteal thickening.

Left ostiomeatal unit: Patent (coronal image 30).

Nasal passages: Intact nasal septum with leftward mid septal
deviation with some spurring, and rightward anterior septal
deviation. Sequelae of right middle turbinectomy. Symmetric
appearing bilateral nasal cavity mucosal thickening with small
volume retained secretions. The olfactory recesses remain
pneumatized.

Anatomy:

Hyperplastic sphenoid sinuses (coronal image 48) without clinoid
process pneumatization.

There does appear to be partial uncovering of both vidian canals,
the left foramen rotundum, and the bilateral carotid canals on
coronal image 47.

Anterior ethmoidal artery position identified on coronal image 29
with adjacent frontal sinus pneumatization.

Keros type [DATE]  olfactory fossa (coronal image 31).

Other: Stable and negative visible noncontrast brain parenchyma.

Visualized orbit soft tissues are within normal limits. Visualized
scalp soft tissues are within normal limits. Negative visible
noncontrast deep soft tissue spaces of the face; postinflammatory
dystrophic calcifications of the palatine tonsils.

Bilateral tympanic cavities and mastoids remain clear.

No acute maxillary dental findings are evident. No acute osseous
abnormality identified.
IMPRESSION: 1. Right maxillary antrostomy since 8394 for treatment of right
maxillary mucocele. Moderate residual circumferential mucoperiosteal
thickening. Superimposed acute rhinitis suspected.
2. Mild mucosal thickening along the floor of the hyperplastic right
sphenoid sinus plus a new 9 mm focus of calcified mucosa/retention
cyst/loose body (series 3, image 51).
3. Other paranasal sinuses remain well pneumatized. Note sphenoid
and frontal sinus hyperplasia.

## 2019-12-03 DIAGNOSIS — E782 Mixed hyperlipidemia: Secondary | ICD-10-CM | POA: Diagnosis not present

## 2019-12-03 DIAGNOSIS — E8881 Metabolic syndrome: Secondary | ICD-10-CM | POA: Diagnosis not present

## 2019-12-03 DIAGNOSIS — I2510 Atherosclerotic heart disease of native coronary artery without angina pectoris: Secondary | ICD-10-CM | POA: Diagnosis not present

## 2019-12-03 DIAGNOSIS — E7800 Pure hypercholesterolemia, unspecified: Secondary | ICD-10-CM | POA: Diagnosis not present

## 2019-12-03 DIAGNOSIS — R739 Hyperglycemia, unspecified: Secondary | ICD-10-CM | POA: Diagnosis not present

## 2019-12-09 DIAGNOSIS — R739 Hyperglycemia, unspecified: Secondary | ICD-10-CM | POA: Diagnosis not present

## 2019-12-09 DIAGNOSIS — Z6823 Body mass index (BMI) 23.0-23.9, adult: Secondary | ICD-10-CM | POA: Diagnosis not present

## 2019-12-09 DIAGNOSIS — B0223 Postherpetic polyneuropathy: Secondary | ICD-10-CM | POA: Diagnosis not present

## 2019-12-09 DIAGNOSIS — E782 Mixed hyperlipidemia: Secondary | ICD-10-CM | POA: Diagnosis not present

## 2019-12-09 DIAGNOSIS — I2510 Atherosclerotic heart disease of native coronary artery without angina pectoris: Secondary | ICD-10-CM | POA: Diagnosis not present

## 2020-03-27 DIAGNOSIS — I440 Atrioventricular block, first degree: Secondary | ICD-10-CM | POA: Diagnosis not present

## 2020-03-27 DIAGNOSIS — S91012A Laceration without foreign body, left ankle, initial encounter: Secondary | ICD-10-CM | POA: Diagnosis not present

## 2020-03-27 DIAGNOSIS — S85162A Unspecified injury of posterior tibial artery, left leg, initial encounter: Secondary | ICD-10-CM | POA: Diagnosis not present

## 2020-03-27 DIAGNOSIS — S81812A Laceration without foreign body, left lower leg, initial encounter: Secondary | ICD-10-CM | POA: Diagnosis not present

## 2020-03-27 DIAGNOSIS — W28XXXA Contact with powered lawn mower, initial encounter: Secondary | ICD-10-CM | POA: Diagnosis not present

## 2020-03-28 DIAGNOSIS — S8263XA Displaced fracture of lateral malleolus of unspecified fibula, initial encounter for closed fracture: Secondary | ICD-10-CM | POA: Diagnosis not present

## 2020-03-31 DIAGNOSIS — S91019A Laceration without foreign body, unspecified ankle, initial encounter: Secondary | ICD-10-CM | POA: Diagnosis not present

## 2020-03-31 DIAGNOSIS — L03119 Cellulitis of unspecified part of limb: Secondary | ICD-10-CM | POA: Diagnosis not present

## 2020-04-07 DIAGNOSIS — L03119 Cellulitis of unspecified part of limb: Secondary | ICD-10-CM | POA: Diagnosis not present

## 2020-04-07 DIAGNOSIS — S91019A Laceration without foreign body, unspecified ankle, initial encounter: Secondary | ICD-10-CM | POA: Diagnosis not present

## 2020-04-07 DIAGNOSIS — Z23 Encounter for immunization: Secondary | ICD-10-CM | POA: Diagnosis not present

## 2020-04-21 DIAGNOSIS — S91019A Laceration without foreign body, unspecified ankle, initial encounter: Secondary | ICD-10-CM | POA: Diagnosis not present

## 2020-04-21 DIAGNOSIS — L03119 Cellulitis of unspecified part of limb: Secondary | ICD-10-CM | POA: Diagnosis not present

## 2020-04-26 DIAGNOSIS — L570 Actinic keratosis: Secondary | ICD-10-CM | POA: Diagnosis not present

## 2020-04-26 DIAGNOSIS — Z85828 Personal history of other malignant neoplasm of skin: Secondary | ICD-10-CM | POA: Diagnosis not present

## 2020-04-26 DIAGNOSIS — D239 Other benign neoplasm of skin, unspecified: Secondary | ICD-10-CM | POA: Diagnosis not present

## 2020-05-11 DIAGNOSIS — S91019A Laceration without foreign body, unspecified ankle, initial encounter: Secondary | ICD-10-CM | POA: Diagnosis not present

## 2020-05-11 DIAGNOSIS — L03119 Cellulitis of unspecified part of limb: Secondary | ICD-10-CM | POA: Diagnosis not present

## 2020-05-11 DIAGNOSIS — Z6824 Body mass index (BMI) 24.0-24.9, adult: Secondary | ICD-10-CM | POA: Diagnosis not present

## 2020-06-15 DIAGNOSIS — E7849 Other hyperlipidemia: Secondary | ICD-10-CM | POA: Diagnosis not present

## 2020-06-15 DIAGNOSIS — R7301 Impaired fasting glucose: Secondary | ICD-10-CM | POA: Diagnosis not present

## 2020-06-15 DIAGNOSIS — E782 Mixed hyperlipidemia: Secondary | ICD-10-CM | POA: Diagnosis not present

## 2020-06-19 DIAGNOSIS — R739 Hyperglycemia, unspecified: Secondary | ICD-10-CM | POA: Diagnosis not present

## 2020-06-19 DIAGNOSIS — E782 Mixed hyperlipidemia: Secondary | ICD-10-CM | POA: Diagnosis not present

## 2020-06-19 DIAGNOSIS — I2510 Atherosclerotic heart disease of native coronary artery without angina pectoris: Secondary | ICD-10-CM | POA: Diagnosis not present

## 2020-06-19 DIAGNOSIS — Z0001 Encounter for general adult medical examination with abnormal findings: Secondary | ICD-10-CM | POA: Diagnosis not present

## 2020-06-19 DIAGNOSIS — B0223 Postherpetic polyneuropathy: Secondary | ICD-10-CM | POA: Diagnosis not present

## 2020-06-19 DIAGNOSIS — Z6824 Body mass index (BMI) 24.0-24.9, adult: Secondary | ICD-10-CM | POA: Diagnosis not present

## 2020-06-19 DIAGNOSIS — R7301 Impaired fasting glucose: Secondary | ICD-10-CM | POA: Diagnosis not present

## 2020-08-08 ENCOUNTER — Other Ambulatory Visit: Payer: Self-pay

## 2020-08-08 ENCOUNTER — Ambulatory Visit: Payer: Medicare HMO | Admitting: Neurology

## 2020-08-08 ENCOUNTER — Encounter: Payer: Self-pay | Admitting: Neurology

## 2020-08-08 VITALS — BP 109/62 | HR 69 | Ht 73.0 in | Wt 182.0 lb

## 2020-08-08 DIAGNOSIS — B0229 Other postherpetic nervous system involvement: Secondary | ICD-10-CM | POA: Diagnosis not present

## 2020-08-08 MED ORDER — GABAPENTIN 800 MG PO TABS: 800.0000 mg | ORAL_TABLET | Freq: Three times a day (TID) | ORAL | 3 refills | Status: DC

## 2020-08-08 MED ORDER — AMITRIPTYLINE HCL 10 MG PO TABS: 20.0000 mg | ORAL_TABLET | Freq: Every day | ORAL | 3 refills | Status: DC

## 2020-08-08 NOTE — Patient Instructions (Signed)
Try the gabapentin 800 mg 3 times daily Continue the amitriptyline  Consider the pain referral  See you back in 1 year

## 2020-08-08 NOTE — Progress Notes (Signed)
PATIENT: Seth Higgins DOB: 1940/07/04  REASON FOR VISIT: follow up HISTORY FROM: patient  HISTORY OF PRESENT ILLNESS: Today 08/08/20  Seth Higgins is an 80 year old male with history of left C2-3 postherpetic neuralgia since 2016.  He is on amitriptyline and gabapentin.  Has been referred to the [facility], didn't follow-up on it. Has continued pain, itching, may scratch so much he bleeds to left side of neck under jaw. Starts as pain, to get rid of it, he scratches. Tolerating the medications okay. A cool pack will help. Feels like sunburn to left occipital area, radiates down under left jaw. He had COVID, but wasn't diagnosed, was mild. Had lawnmower accident to left foot with laceration. Has blood work with PCP, has reportedly all been normal. Pain is bothersome at different points during the day or night.  Here today accompanied by his wife.  Update 08/09/2019 SS: Seth Higgins is a 80 year old male with history of left C2-3 postherpetic neuralgia since 2016.  He is taking amitriptyline and gabapentin.  When last seen he was referred to the [facility] for possible rhizotomy, but did not follow-up because his wife would not be able to accompany him.  He has reported increase in pain in the left side occipital area, to his jaw, neck, occasionally left shoulder.  He is hypersensitive to touch in this area.  It feels like a sunburn.  He applies lidocaine cream at night without much benefit.  The summer heat makes it worse.  He sees his PCP, reports blood work has been normal.  He remains active, does remodeling work.  Tolerating medications without side effect.  He presents today accompanied by his wife.  HISTORY 08/06/2018 Dr. Jannifer Franklin: Seth Higgins is a 80 year old right-handed white male with a history of a left C2-3 postherpetic neuralgia.  The patient has had reduction of pain but not cessation of pain with use of amitriptyline and gabapentin.  The patient is on 400 mg twice  daily of gabapentin and takes 20 mg at night of the amitriptyline.  The patient tolerates medications very well.  He still has significant hypersensitivity to light touch on the left upper shoulder, but he is able to sleep fairly well at night.  He has recently had sinus surgery which is helped his apneic episodes at night.  The patient returns to the office today for an evaluation.  He does use topical lidocaine cream on the shoulder at times.  REVIEW OF SYSTEMS: Out of a complete 14 system review of symptoms, the patient complains only of the following symptoms, and all other reviewed systems are negative.  See HPI  ALLERGIES: No Known Allergies  HOME MEDICATIONS: Outpatient Medications Prior to Visit  Medication Sig Dispense Refill  . Lidocaine 4 % LOTN Apply 1 g topically 3 (three) times daily as needed. (Patient taking differently: Apply 1 g topically 3 (three) times daily as needed (pain from shingles on neck).) 1 Bottle 5  . loratadine (CLARITIN) 10 MG tablet Take 10 mg by mouth daily.    . Multiple Vitamins-Minerals (MULTIVITAMIN ADULTS PO) Take 1 tablet by mouth daily.    . nitroGLYCERIN (NITROSTAT) 0.4 MG SL tablet Place 1 tablet (0.4 mg total) under the tongue every 5 (five) minutes as needed. 25 tablet 3  . rosuvastatin (CRESTOR) 40 MG tablet Take 10 mg by mouth at bedtime.   4  . amitriptyline (ELAVIL) 10 MG tablet Take 2 tablets (20 mg total) by mouth at bedtime. 180 tablet  3  . gabapentin (NEURONTIN) 600 MG tablet Take 1 tablet (600 mg total) by mouth 3 (three) times daily. 90 tablet 11   No facility-administered medications prior to visit.    PAST MEDICAL HISTORY: Past Medical History:  Diagnosis Date  . CAD (coronary artery disease)    NSTEMI/DES RCA, 05/11. EF 50%,inferoapical HK  . Cancer [facility]) 1999   Colon Cancer  . Dyslipidemia   . History of colon cancer    pre-cancer  . Post herpetic neuralgia 10/17/2016   Left C2-3    PAST SURGICAL HISTORY: Past Surgical  History:  Procedure Laterality Date  . CARDIAC CATHETERIZATION  09/2009   with stent placement  . COLON SURGERY  1999  . COLONOSCOPY [name] 07/01/2012   Procedure: COLONOSCOPY;  Surgeon: Rogene Houston, MD;  [facility];  Service: Endoscopy;  Laterality: [name];  830  . COLONOSCOPY [name] 12/10/2017   Procedure: COLONOSCOPY;  Surgeon: Rogene Houston, MD;  [facility];  Service: Endoscopy;  Laterality: [name];  830  . HERNIA REPAIR     LEFT  . MAXILLARY ANTROSTOMY Right 05/18/2018   Procedure: RIGHT MAXILLARY ANTROSTOMY WITH TISSUE REMOVAL;  Surgeon: Leta Baptist, MD;  Location: [place];  Service: ENT;  Laterality: Right;  . NASAL SEPTOPLASTY W/ TURBINOPLASTY [name] 05/18/2018   Procedure: NASAL SEPTOPLASTY WITH TURBINATE REDUCTION;  Surgeon: Leta Baptist, MD;  Location: [place];  Service: ENT;  Laterality: [name];  . POLYPECTOMY  12/10/2017   Procedure: POLYPECTOMY;  Surgeon: Rogene Houston, MD;  [facility];  Service: Endoscopy;;  colon  . SEPTOPLASTY WITH ETHMOIDECTOMY, AND MAXILLARY ANTROSTOMY Right 05/18/2018   Procedure: SEPTOPLASTY WITH RIGHT ETHMOIDECTOMY AND SPHENOIDECTOMY WITH TISSUE REMOVAL;  Surgeon: Leta Baptist, MD;  Location: [place];  Service: ENT;  Laterality: Right;  . SINUS ENDO W/FUSION Right 05/18/2018   Procedure: ENDOSCOPIC SINUS SURGERY WITH NAVIGATION;  Surgeon: Leta Baptist, MD;  Location: [place];  Service: ENT;  Laterality: Right;    FAMILY HISTORY: Family History  Problem Relation Age of Onset  . Coronary artery disease Father        had CABG in 1970  . Colon cancer Father     SOCIAL HISTORY: Social History   Socioeconomic History  . Marital status: Married    Spouse name: Vaughan Basta  . Number of children: 0  . Years of education: 72  . Highest education level: Not on file  Occupational History  . Not on file  Tobacco Use  . Smoking status: Former Smoker    Packs/day: 1.00     Years: 10.00    Pack years: 10.00    Types: Cigarettes    Quit date: 05/20/1968    Years since quitting: 52.2  . Smokeless tobacco: Never Used  Vaping Use  . Vaping Use: Never used  Substance and Sexual Activity  . Alcohol use: No  . Drug use: No  . Sexual activity: Not on file  Other Topics Concern  . Not on file  Social History Narrative   Lives with wife   Caffeine use: Very little per day (1 cup per day)   Right handed   Pt donates blood every 2 months   works out at the [employer] and walks   Social Determinants of Radio broadcast assistant Strain: Not on file  Food Insecurity: Not on file  Transportation Needs: Not on file  Physical Activity: Not on file  Stress:  Not on file  Social Connections: Not on file  Intimate Partner Violence: Not on file   PHYSICAL EXAM  Vitals:   08/08/20 1113  BP: 109/62  Pulse: 69  Weight: 182 lb (82.6 kg)  Height: _0  (1.854 m)   Body mass index is 24.01 kg/m.  Generalized: Well developed, in no acute distress   Neurological examination  Mentation: Alert oriented to time, place, history taking. Follows all commands speech and language fluent Cranial nerve II-XII: Pupils were equal round reactive to light. Extraocular movements were full, visual field were full on confrontational test. Facial sensation and strength were normal.  Head turning and shoulder shrug were normal and symmetric. Motor: The motor testing reveals 5 over 5 strength of all 4 extremities. Good symmetric motor tone is noted throughout.  Sensory: Sensory testing is intact to soft touch on all 4 extremities. No evidence of extinction is noted.  Sensory testing to his face is symmetric to soft touch. Coordination: Cerebellar testing reveals good finger-nose-finger and heel-to-shin bilaterally.  Gait and station: Gait is normal.  Reflexes: Deep tendon reflexes are symmetric    DIAGNOSTIC DATA (LABS, IMAGING, TESTING) - I reviewed patient records, labs, notes, testing  and imaging myself where available.  Lab Results  Component Value Date   WBC 8.1 09/26/2009   HGB 15.2 09/26/2009   HCT 43.3 09/26/2009   MCV 94.3 09/26/2009   PLT 212 09/26/2009      Component Value Date/Time   NA 140 09/26/2009 0417   K 4.0 09/26/2009 0417   CL 109 09/26/2009 0417   CO2 24 09/26/2009 0417   GLUCOSE 101 (H) 09/26/2009 0417   BUN 10 09/26/2009 0417   CREATININE 0.98 09/26/2009 0417   CALCIUM 8.7 09/26/2009 0417   GFRNONAA >60 09/26/2009 0417   GFRAA  09/26/2009 0417    >60        The eGFR has been calculated using the MDRD equation. This calculation has not been validated in all clinical situations. eGFR's persistently <60 mL/min signify possible Chronic Kidney Disease.   Lab Results  Component Value Date   CHOL  09/25/2009    150        ATP III CLASSIFICATION:  <200     mg/dL   Desirable  200-239  mg/dL   Borderline High  >=240    mg/dL   High          HDL 32 (L) 09/25/2009   LDLCALC (H) 09/25/2009    104        Total Cholesterol/HDL:CHD Risk Coronary Heart Disease Risk Table                     Men   Women  1/2 Average Risk   3.4   3.3  Average Risk       5.0   4.4  2 X Average Risk   9.6   7.1  3 X Average Risk  23.4   11.0        Use the calculated Patient Ratio above and the CHD Risk Table to determine the patient's CHD Risk.        ATP III CLASSIFICATION (LDL):  <100     mg/dL   Optimal  100-129  mg/dL   Near or Above                    Optimal  130-159  mg/dL   Borderline  160-189  mg/dL   High  >  190     mg/dL   Very High   TRIG 70 09/25/2009   CHOLHDL 4.7 09/25/2009   Lab Results  Component Value Date   HGBA1C  09/25/2009    5.2 (NOTE)                                                                       According to the ADA Clinical Practice Recommendations for 2011, when HbA1c is used as a screening test:   >=6.5%   Diagnostic of Diabetes Mellitus           (if abnormal result  is confirmed)  5.7-6.4%   Increased risk  of developing Diabetes Mellitus  References:Diagnosis and Classification of Diabetes Mellitus,Diabetes IRWE,3154,00(QQPYP 1):S62-S69 and Standards of Medical Care in         Diabetes - 2011,Diabetes Care,2011,34  (Suppl 1):S11-S61.   No results found for: VITAMINB12 No results found for: TSH  ASSESSMENT AND PLAN 80 y.o. year old male  has a past medical history of CAD (coronary artery disease), Cancer ([facility]) (1999), Dyslipidemia, History of colon cancer, and Post herpetic neuralgia (10/17/2016). here with:  1.  Postherpetic neuralgia, left C2-3  -Increase gabapentin 800 mg 3 times daily -Continue amitriptyline 20 mg at bedtime -Apply cool pack as needed to the area for comfort -If higher dose gabapentin is not effective, again consider [facility] -Follow-up in 1 year or sooner if needed, encouraged to call for dose adjustment  I spent 20 minutes of face-to-face and non-face-to-face time with patient. This included previsit chart review, lab review, study review, order entry, electronic health record documentation, patient education.  Butler Denmark, AGNP-C, DNP 08/08/2020, 11:42 AM [facility] [address] [place], [place] [address] [phone]

## 2020-08-09 NOTE — Progress Notes (Signed)
I have read the note, and I agree with the clinical assessment and plan.  Accalia Rigdon K Anaika Santillano   

## 2020-10-17 DIAGNOSIS — J069 Acute upper respiratory infection, unspecified: Secondary | ICD-10-CM | POA: Diagnosis not present

## 2020-10-17 DIAGNOSIS — Z20828 Contact with and (suspected) exposure to other viral communicable diseases: Secondary | ICD-10-CM | POA: Diagnosis not present

## 2020-10-17 DIAGNOSIS — R509 Fever, unspecified: Secondary | ICD-10-CM | POA: Diagnosis not present

## 2020-10-18 ENCOUNTER — Telehealth: Payer: Self-pay | Admitting: Neurology

## 2020-10-18 DIAGNOSIS — B0229 Other postherpetic nervous system involvement: Secondary | ICD-10-CM

## 2020-10-18 NOTE — Telephone Encounter (Signed)
Pt's wife, Dhanvin Szeto (on [place]) called, were told to call if gabapentin (NEURONTIN) 800 MG tablet did not work for his shingle pain. He is interested in a referral for pain. Would like a call from the nurse.

## 2020-10-18 NOTE — Telephone Encounter (Signed)
I called wife the patient.  Would like to proceed with pain management referral.   In [place] is preferable.

## 2020-10-27 ENCOUNTER — Telehealth: Payer: Self-pay

## 2020-10-27 NOTE — Telephone Encounter (Signed)
I called patient.  I spoke to patient's wife, Vaughan Basta.  Patient has not seen a pain specialist yet.  Patient would prefer a pain clinic in [place].  She would like patient to see [facility].  I advised her that I will check with them regarding his insurance coverage.  I called [facility].  No answer, left a voicemail asking him to call me back.

## 2020-10-30 NOTE — Telephone Encounter (Signed)
[facility] does not accept [employer].  I called [facility] to find out if they will accept patient for pain management.  No answer, left a voicemail asking them to call me back.  Phone: [phone] Fax: [phone]

## 2020-10-30 NOTE — Telephone Encounter (Signed)
I called patient's wife, per DPR.  [facility] is not accepting patients anymore.  [facility] in [place] will accept [facility]  I will send a referral to them.  I gave patient's wife their phone number and asked her to call them within a week if she has not heard regarding appointment.  P: [phone] F: [phone]

## 2020-11-06 DIAGNOSIS — B0229 Other postherpetic nervous system involvement: Secondary | ICD-10-CM | POA: Diagnosis not present

## 2020-11-06 DIAGNOSIS — G894 Chronic pain syndrome: Secondary | ICD-10-CM | POA: Diagnosis not present

## 2020-11-06 DIAGNOSIS — R519 Headache, unspecified: Secondary | ICD-10-CM | POA: Diagnosis not present

## 2020-11-06 DIAGNOSIS — M542 Cervicalgia: Secondary | ICD-10-CM | POA: Diagnosis not present

## 2020-11-15 DIAGNOSIS — G894 Chronic pain syndrome: Secondary | ICD-10-CM | POA: Diagnosis not present

## 2020-11-15 DIAGNOSIS — R519 Headache, unspecified: Secondary | ICD-10-CM | POA: Diagnosis not present

## 2020-11-15 DIAGNOSIS — B0223 Postherpetic polyneuropathy: Secondary | ICD-10-CM | POA: Diagnosis not present

## 2020-11-15 DIAGNOSIS — M542 Cervicalgia: Secondary | ICD-10-CM | POA: Diagnosis not present

## 2020-11-15 DIAGNOSIS — B0229 Other postherpetic nervous system involvement: Secondary | ICD-10-CM | POA: Diagnosis not present

## 2020-12-04 DIAGNOSIS — R739 Hyperglycemia, unspecified: Secondary | ICD-10-CM | POA: Diagnosis not present

## 2020-12-04 DIAGNOSIS — E782 Mixed hyperlipidemia: Secondary | ICD-10-CM | POA: Diagnosis not present

## 2020-12-04 DIAGNOSIS — E8881 Metabolic syndrome: Secondary | ICD-10-CM | POA: Diagnosis not present

## 2020-12-04 DIAGNOSIS — E7849 Other hyperlipidemia: Secondary | ICD-10-CM | POA: Diagnosis not present

## 2020-12-07 DIAGNOSIS — Z6823 Body mass index (BMI) 23.0-23.9, adult: Secondary | ICD-10-CM | POA: Diagnosis not present

## 2020-12-07 DIAGNOSIS — E7849 Other hyperlipidemia: Secondary | ICD-10-CM | POA: Diagnosis not present

## 2020-12-07 DIAGNOSIS — R739 Hyperglycemia, unspecified: Secondary | ICD-10-CM | POA: Diagnosis not present

## 2020-12-07 DIAGNOSIS — B0223 Postherpetic polyneuropathy: Secondary | ICD-10-CM | POA: Diagnosis not present

## 2020-12-07 DIAGNOSIS — R7303 Prediabetes: Secondary | ICD-10-CM | POA: Diagnosis not present

## 2020-12-07 DIAGNOSIS — I2510 Atherosclerotic heart disease of native coronary artery without angina pectoris: Secondary | ICD-10-CM | POA: Diagnosis not present

## 2020-12-07 DIAGNOSIS — R490 Dysphonia: Secondary | ICD-10-CM | POA: Diagnosis not present

## 2020-12-12 DIAGNOSIS — B349 Viral infection, unspecified: Secondary | ICD-10-CM | POA: Diagnosis not present

## 2020-12-12 DIAGNOSIS — Z20828 Contact with and (suspected) exposure to other viral communicable diseases: Secondary | ICD-10-CM | POA: Diagnosis not present

## 2020-12-18 DIAGNOSIS — J189 Pneumonia, unspecified organism: Secondary | ICD-10-CM | POA: Diagnosis not present

## 2020-12-18 DIAGNOSIS — R300 Dysuria: Secondary | ICD-10-CM | POA: Diagnosis not present

## 2020-12-18 DIAGNOSIS — Z20828 Contact with and (suspected) exposure to other viral communicable diseases: Secondary | ICD-10-CM | POA: Diagnosis not present

## 2020-12-18 DIAGNOSIS — R410 Disorientation, unspecified: Secondary | ICD-10-CM | POA: Diagnosis not present

## 2020-12-21 DIAGNOSIS — Z6822 Body mass index (BMI) 22.0-22.9, adult: Secondary | ICD-10-CM | POA: Diagnosis not present

## 2020-12-21 DIAGNOSIS — J189 Pneumonia, unspecified organism: Secondary | ICD-10-CM | POA: Diagnosis not present

## 2021-01-01 ENCOUNTER — Other Ambulatory Visit: Payer: Self-pay | Admitting: Pain Medicine

## 2021-01-01 DIAGNOSIS — B0229 Other postherpetic nervous system involvement: Secondary | ICD-10-CM

## 2021-01-01 DIAGNOSIS — G8929 Other chronic pain: Secondary | ICD-10-CM

## 2021-01-10 DIAGNOSIS — B0229 Other postherpetic nervous system involvement: Secondary | ICD-10-CM | POA: Diagnosis not present

## 2021-01-10 DIAGNOSIS — R519 Headache, unspecified: Secondary | ICD-10-CM | POA: Diagnosis not present

## 2021-01-10 DIAGNOSIS — M542 Cervicalgia: Secondary | ICD-10-CM | POA: Diagnosis not present

## 2021-01-10 DIAGNOSIS — B0223 Postherpetic polyneuropathy: Secondary | ICD-10-CM | POA: Diagnosis not present

## 2021-02-07 DIAGNOSIS — B0229 Other postherpetic nervous system involvement: Secondary | ICD-10-CM | POA: Diagnosis not present

## 2021-02-07 DIAGNOSIS — G894 Chronic pain syndrome: Secondary | ICD-10-CM | POA: Diagnosis not present

## 2021-02-07 DIAGNOSIS — M542 Cervicalgia: Secondary | ICD-10-CM | POA: Diagnosis not present

## 2021-02-07 DIAGNOSIS — B0223 Postherpetic polyneuropathy: Secondary | ICD-10-CM | POA: Diagnosis not present

## 2021-04-09 DIAGNOSIS — R519 Headache, unspecified: Secondary | ICD-10-CM | POA: Diagnosis not present

## 2021-04-09 DIAGNOSIS — G894 Chronic pain syndrome: Secondary | ICD-10-CM | POA: Diagnosis not present

## 2021-04-09 DIAGNOSIS — B0223 Postherpetic polyneuropathy: Secondary | ICD-10-CM | POA: Diagnosis not present

## 2021-04-09 DIAGNOSIS — B0229 Other postherpetic nervous system involvement: Secondary | ICD-10-CM | POA: Diagnosis not present

## 2021-04-18 DIAGNOSIS — B0223 Postherpetic polyneuropathy: Secondary | ICD-10-CM | POA: Diagnosis not present

## 2021-04-26 DIAGNOSIS — L570 Actinic keratosis: Secondary | ICD-10-CM | POA: Diagnosis not present

## 2021-04-26 DIAGNOSIS — C4442 Squamous cell carcinoma of skin of scalp and neck: Secondary | ICD-10-CM | POA: Diagnosis not present

## 2021-04-26 DIAGNOSIS — D044 Carcinoma in situ of skin of scalp and neck: Secondary | ICD-10-CM | POA: Diagnosis not present

## 2021-05-10 DIAGNOSIS — C4442 Squamous cell carcinoma of skin of scalp and neck: Secondary | ICD-10-CM | POA: Diagnosis not present

## 2021-06-11 DIAGNOSIS — E8881 Metabolic syndrome: Secondary | ICD-10-CM | POA: Diagnosis not present

## 2021-06-11 DIAGNOSIS — E7801 Familial hypercholesterolemia: Secondary | ICD-10-CM | POA: Diagnosis not present

## 2021-06-11 DIAGNOSIS — E7800 Pure hypercholesterolemia, unspecified: Secondary | ICD-10-CM | POA: Diagnosis not present

## 2021-06-11 DIAGNOSIS — R739 Hyperglycemia, unspecified: Secondary | ICD-10-CM | POA: Diagnosis not present

## 2021-06-11 DIAGNOSIS — E7849 Other hyperlipidemia: Secondary | ICD-10-CM | POA: Diagnosis not present

## 2021-06-11 DIAGNOSIS — E782 Mixed hyperlipidemia: Secondary | ICD-10-CM | POA: Diagnosis not present

## 2021-06-14 DIAGNOSIS — B0223 Postherpetic polyneuropathy: Secondary | ICD-10-CM | POA: Diagnosis not present

## 2021-06-14 DIAGNOSIS — Z1331 Encounter for screening for depression: Secondary | ICD-10-CM | POA: Diagnosis not present

## 2021-06-14 DIAGNOSIS — Z1389 Encounter for screening for other disorder: Secondary | ICD-10-CM | POA: Diagnosis not present

## 2021-06-14 DIAGNOSIS — I2510 Atherosclerotic heart disease of native coronary artery without angina pectoris: Secondary | ICD-10-CM | POA: Diagnosis not present

## 2021-06-14 DIAGNOSIS — R739 Hyperglycemia, unspecified: Secondary | ICD-10-CM | POA: Diagnosis not present

## 2021-06-14 DIAGNOSIS — R7303 Prediabetes: Secondary | ICD-10-CM | POA: Diagnosis not present

## 2021-06-14 DIAGNOSIS — R490 Dysphonia: Secondary | ICD-10-CM | POA: Diagnosis not present

## 2021-06-14 DIAGNOSIS — M1612 Unilateral primary osteoarthritis, left hip: Secondary | ICD-10-CM | POA: Diagnosis not present

## 2021-06-29 DIAGNOSIS — R490 Dysphonia: Secondary | ICD-10-CM | POA: Diagnosis not present

## 2021-06-29 DIAGNOSIS — K219 Gastro-esophageal reflux disease without esophagitis: Secondary | ICD-10-CM | POA: Diagnosis not present

## 2021-07-02 ENCOUNTER — Ambulatory Visit: Payer: Medicare HMO | Admitting: Family Medicine

## 2021-08-08 ENCOUNTER — Encounter: Payer: Self-pay | Admitting: Neurology

## 2021-08-08 ENCOUNTER — Ambulatory Visit: Payer: Medicare HMO | Admitting: Neurology

## 2021-08-08 VITALS — BP 124/64 | HR 80 | Ht 73.0 in | Wt 190.0 lb

## 2021-08-08 DIAGNOSIS — B0229 Other postherpetic nervous system involvement: Secondary | ICD-10-CM | POA: Diagnosis not present

## 2021-08-08 MED ORDER — GABAPENTIN 800 MG PO TABS: 800.0000 mg | ORAL_TABLET | Freq: Three times a day (TID) | ORAL | 0 refills | Status: DC

## 2021-08-08 MED ORDER — AMITRIPTYLINE HCL 10 MG PO TABS: 20.0000 mg | ORAL_TABLET | Freq: Every day | ORAL | 0 refills | Status: DC

## 2021-08-08 NOTE — Patient Instructions (Signed)
Decrease amitriptyline to 10 mg at bedtime or a few weeks then stop it if there is no worsening of pain  ?Continue gabapentin, Cymbalta ?Please see Dr. Vira Blanco again  ?Will leave next open here, call us if needed ?

## 2021-08-08 NOTE — Progress Notes (Signed)
? ? ?PATIENT: Seth Higgins ?DOB: [birth year] ? ?REASON FOR VISIT: follow up for left C2-3 postherpetic neuralgia ?HISTORY FROM: patient, wife ?PRIMARY NEUROLOGIST: Dr. Jannifer Franklin  ? ?HISTORY OF PRESENT ILLNESS: ?Today 08/08/21  ?Seth Higgins here today for follow-up.  Referred to pain clinic [facility]. In on gabapentin and amitriptyline. Dr. Vira Blanco gave a steroid injection, excellent benefit, last several weeks. Was placed on Cymbalta. Got 2nd injection, no better. Total of 3 injections. Told could continue going for injections, but he isn't sure. Told not option for ablation. As long as doesn't touch area under left jaw, isn't an issue. Sometimes the burning is so intense he will scratch the skin, make it swell. Uses lidocaine cream. Is drowsy from the medications during the daytime. Open to seeing Dr. Vira Blanco again. Told injections, Cymbalta are a starting point. Some trembling in his hands, he can still work on his clocks, thinks related to being overmedicated. Symptoms since 2011. ? ?Update 08/08/2020 SS: Mr Higgins is an 81 year old male with history of left C2-3 postherpetic neuralgia since 2016.  He is on amitriptyline and gabapentin.  Has been referred to the [facility], didn't follow-up on it. Has continued pain, itching, may scratch so much he bleeds to left side of neck under jaw. Starts as pain, to get rid of it, he scratches. Tolerating the medications okay. A cool pack will help. Feels like sunburn to left occipital area, radiates down under left jaw. He had COVID, but wasn't diagnosed, was mild. Had lawnmower accident to left foot with laceration. Has blood work with PCP, has reportedly all been normal. Pain is bothersome at different points during the day or night.  Here today accompanied by his wife. ? ?Update 08/09/2019 SS: Seth Higgins is a 81 year old male with history of left C2-3 postherpetic neuralgia since 2016.  He is taking amitriptyline and gabapentin.  When last seen  he was referred to the [facility] for possible rhizotomy, but did not follow-up because his wife would not be able to accompany him.  He has reported increase in pain in the left side occipital area, to his jaw, neck, occasionally left shoulder.  He is hypersensitive to touch in this area.  It feels like a sunburn.  He applies lidocaine cream at night without much benefit.  The summer heat makes it worse.  He sees his PCP, reports blood work has been normal.  He remains active, does remodeling work.  Tolerating medications without side effect.  He presents today accompanied by his wife. ? ?HISTORY ?08/06/2018 Dr. Jannifer Franklin: Seth Higgins is a 81 year old right-handed white male with a history of a left C2-3 postherpetic neuralgia.  The patient has had reduction of pain but not cessation of pain with use of amitriptyline and gabapentin.  The patient is on 400 mg twice daily of gabapentin and takes 20 mg at night of the amitriptyline.  The patient tolerates medications very well.  He still has significant hypersensitivity to light touch on the left upper shoulder, but he is able to sleep fairly well at night.  He has recently had sinus surgery which is helped his apneic episodes at night.  The patient returns to the office today for an evaluation.  He does use topical lidocaine cream on the shoulder at times. ? ?REVIEW OF SYSTEMS: Out of a complete 14 system review of symptoms, the patient complains only of the following symptoms, and all other reviewed systems are negative. ? ?See HPI ? ?ALLERGIES: ?  No Known Allergies ? ?HOME MEDICATIONS: ?Outpatient Medications Prior to Visit  ?Medication Sig Dispense Refill  ? acetaminophen (TYLENOL) 500 MG tablet Take 500 mg by mouth every 6 (six) hours as needed.    ? amitriptyline (ELAVIL) 10 MG tablet Take 2 tablets (20 mg total) by mouth at bedtime. 180 tablet 3  ? aspirin EC 81 MG tablet Take 81 mg by mouth daily. Swallow whole.    ? DULoxetine (CYMBALTA) 30 MG capsule  Take 30 mg by mouth 2 (two) times daily.    ? famotidine (PEPCID) 20 MG tablet Take 20 mg by mouth at bedtime.    ? gabapentin (NEURONTIN) 800 MG tablet Take 1 tablet (800 mg total) by mouth 3 (three) times daily. 270 tablet 3  ? ibuprofen (ADVIL) 200 MG tablet Take 200 mg by mouth 2 (two) times daily.    ? Lidocaine 4 % LOTN Apply 1 g topically 3 (three) times daily as needed. (Patient taking differently: Apply 1 g topically 3 (three) times daily as needed (pain from shingles on neck).) 1 Bottle 5  ? loratadine (CLARITIN) 10 MG tablet Take 10 mg by mouth daily.    ? Multiple Vitamins-Minerals (MULTIVITAMIN ADULTS PO) Take 1 tablet by mouth daily.    ? nitroGLYCERIN (NITROSTAT) 0.4 MG SL tablet Place 1 tablet (0.4 mg total) under the tongue every 5 (five) minutes as needed. 25 tablet 3  ? Omega-3 Fatty Acids (FISH OIL) 1000 MG CAPS Take 1 capsule by mouth daily.    ? rosuvastatin (CRESTOR) 40 MG tablet Take 10 mg by mouth at bedtime.   4  ? ?No facility-administered medications prior to visit.  ? ? ?PAST MEDICAL HISTORY: ?Past Medical History:  ?Diagnosis Date  ? CAD (coronary artery disease)   ? NSTEMI/DES RCA, 05/11. EF 50%,inferoapical HK  ? Cancer [facility]) 1999  ? Colon Cancer  ? Dyslipidemia   ? History of colon cancer   ? pre-cancer  ? Post herpetic neuralgia 10/17/2016  ? Left C2-3  ? ? ?PAST SURGICAL HISTORY: ?Past Surgical History:  ?Procedure Laterality Date  ? CARDIAC CATHETERIZATION  09/2009  ? with stent placement  ? COLON SURGERY  1999  ? COLONOSCOPY [name] 07/01/2012  ? Procedure: COLONOSCOPY;  Surgeon: Rogene Houston, MD;  [facility];  Service: Endoscopy;  Laterality: [name];  830  ? COLONOSCOPY [name] 12/10/2017  ? Procedure: COLONOSCOPY;  Surgeon: Rogene Houston, MD;  [facility];  Service: Endoscopy;  Laterality: [name];  830  ? HERNIA REPAIR    ? LEFT  ? MAXILLARY ANTROSTOMY Right 05/18/2018  ? Procedure: RIGHT MAXILLARY ANTROSTOMY WITH TISSUE REMOVAL;  Surgeon: Leta Baptist, MD;  Location:  [place];  Service: ENT;  Laterality: Right;  ? NASAL SEPTOPLASTY W/ TURBINOPLASTY [name] 05/18/2018  ? Procedure: NASAL SEPTOPLASTY WITH TURBINATE REDUCTION;  Surgeon: Leta Baptist, MD;  Location: [place];  Service: ENT;  Laterality: [name];  ? POLYPECTOMY  12/10/2017  ? Procedure: POLYPECTOMY;  Surgeon: Rogene Houston, MD;  [facility];  Service: Endoscopy;;  colon  ? SEPTOPLASTY WITH ETHMOIDECTOMY, AND MAXILLARY ANTROSTOMY Right 05/18/2018  ? Procedure: SEPTOPLASTY WITH RIGHT ETHMOIDECTOMY AND SPHENOIDECTOMY WITH TISSUE REMOVAL;  Surgeon: Leta Baptist, MD;  Location: [place];  Service: ENT;  Laterality: Right;  ? SINUS ENDO W/FUSION Right 05/18/2018  ? Procedure: ENDOSCOPIC SINUS SURGERY WITH NAVIGATION;  Surgeon: Leta Baptist, MD;  Location: [place];  Service: ENT;  Laterality:  Right;  ? ? ?FAMILY HISTORY: ?Family History  ?Problem Relation Age of Onset  ? Coronary artery disease Father   ?     had CABG in 1970  ? Colon cancer Father   ? ? ?SOCIAL HISTORY: ?Social History  ? ?Socioeconomic History  ? Marital status: Married  ?  Spouse name: Vaughan Basta  ? Number of children: 0  ? Years of education: 23  ? Highest education level: Not on file  ?Occupational History  ? Not on file  ?Tobacco Use  ? Smoking status: Former  ?  Packs/day: 1.00  ?  Years: 10.00  ?  Pack years: 10.00  ?  Types: Cigarettes  ?  Quit date: 05/20/1968  ?  Years since quitting: 53.2  ? Smokeless tobacco: Never  ?Vaping Use  ? Vaping Use: Never used  ?Substance and Sexual Activity  ? Alcohol use: No  ? Drug use: No  ? Sexual activity: Not on file  ?Other Topics Concern  ? Not on file  ?Social History Narrative  ? Lives with wife  ? Caffeine use: Very little per day (1 cup per day)  ? Right handed  ? Pt donates blood every 2 months  ? works out at [employer] and walks  ? ?Social Determinants of Health  ? ?Financial Resource Strain: Not on file  ?Food Insecurity: Not on file   ?Transportation Needs: Not on file  ?Physical Activity: Not on file  ?Stress: Not on file  ?Social Connections: Not on file  ?Intimate Partner Violence: Not on file  ? ?PHYSICAL EXAM ? ?Vitals:  ? 08/08/21 1107  ?

## 2021-08-17 DIAGNOSIS — R300 Dysuria: Secondary | ICD-10-CM | POA: Diagnosis not present

## 2021-08-17 DIAGNOSIS — Z20822 Contact with and (suspected) exposure to covid-19: Secondary | ICD-10-CM | POA: Diagnosis not present

## 2021-08-17 DIAGNOSIS — N390 Urinary tract infection, site not specified: Secondary | ICD-10-CM | POA: Diagnosis not present

## 2021-08-17 DIAGNOSIS — R509 Fever, unspecified: Secondary | ICD-10-CM | POA: Diagnosis not present

## 2021-08-29 DIAGNOSIS — R490 Dysphonia: Secondary | ICD-10-CM | POA: Diagnosis not present

## 2021-08-29 DIAGNOSIS — K219 Gastro-esophageal reflux disease without esophagitis: Secondary | ICD-10-CM | POA: Diagnosis not present

## 2021-11-01 ENCOUNTER — Other Ambulatory Visit: Payer: Self-pay | Admitting: Neurology

## 2021-12-01 DIAGNOSIS — Z955 Presence of coronary angioplasty implant and graft: Secondary | ICD-10-CM | POA: Diagnosis not present

## 2021-12-01 DIAGNOSIS — I252 Old myocardial infarction: Secondary | ICD-10-CM | POA: Diagnosis not present

## 2021-12-01 DIAGNOSIS — I440 Atrioventricular block, first degree: Secondary | ICD-10-CM | POA: Diagnosis not present

## 2021-12-01 DIAGNOSIS — I214 Non-ST elevation (NSTEMI) myocardial infarction: Secondary | ICD-10-CM | POA: Diagnosis not present

## 2021-12-01 DIAGNOSIS — R079 Chest pain, unspecified: Secondary | ICD-10-CM | POA: Diagnosis not present

## 2021-12-01 DIAGNOSIS — I2510 Atherosclerotic heart disease of native coronary artery without angina pectoris: Secondary | ICD-10-CM | POA: Diagnosis not present

## 2021-12-01 DIAGNOSIS — R918 Other nonspecific abnormal finding of lung field: Secondary | ICD-10-CM | POA: Diagnosis not present

## 2021-12-02 DIAGNOSIS — I440 Atrioventricular block, first degree: Secondary | ICD-10-CM | POA: Diagnosis not present

## 2021-12-02 DIAGNOSIS — R918 Other nonspecific abnormal finding of lung field: Secondary | ICD-10-CM | POA: Diagnosis not present

## 2021-12-02 DIAGNOSIS — R079 Chest pain, unspecified: Secondary | ICD-10-CM | POA: Diagnosis not present

## 2021-12-02 DIAGNOSIS — Z955 Presence of coronary angioplasty implant and graft: Secondary | ICD-10-CM | POA: Diagnosis not present

## 2021-12-02 DIAGNOSIS — I25118 Atherosclerotic heart disease of native coronary artery with other forms of angina pectoris: Secondary | ICD-10-CM | POA: Diagnosis not present

## 2021-12-02 DIAGNOSIS — I959 Hypotension, unspecified: Secondary | ICD-10-CM | POA: Diagnosis not present

## 2021-12-02 DIAGNOSIS — E039 Hypothyroidism, unspecified: Secondary | ICD-10-CM | POA: Diagnosis not present

## 2021-12-02 DIAGNOSIS — R001 Bradycardia, unspecified: Secondary | ICD-10-CM | POA: Diagnosis not present

## 2021-12-02 DIAGNOSIS — I214 Non-ST elevation (NSTEMI) myocardial infarction: Secondary | ICD-10-CM | POA: Diagnosis not present

## 2021-12-02 DIAGNOSIS — R778 Other specified abnormalities of plasma proteins: Secondary | ICD-10-CM | POA: Diagnosis not present

## 2021-12-02 DIAGNOSIS — K219 Gastro-esophageal reflux disease without esophagitis: Secondary | ICD-10-CM | POA: Diagnosis not present

## 2021-12-02 DIAGNOSIS — I252 Old myocardial infarction: Secondary | ICD-10-CM | POA: Diagnosis not present

## 2021-12-02 DIAGNOSIS — Z66 Do not resuscitate: Secondary | ICD-10-CM | POA: Diagnosis not present

## 2021-12-02 DIAGNOSIS — I2510 Atherosclerotic heart disease of native coronary artery without angina pectoris: Secondary | ICD-10-CM | POA: Diagnosis not present

## 2021-12-06 DIAGNOSIS — I214 Non-ST elevation (NSTEMI) myocardial infarction: Secondary | ICD-10-CM | POA: Diagnosis not present

## 2021-12-12 DIAGNOSIS — R7303 Prediabetes: Secondary | ICD-10-CM | POA: Diagnosis not present

## 2021-12-12 DIAGNOSIS — E8881 Metabolic syndrome: Secondary | ICD-10-CM | POA: Diagnosis not present

## 2021-12-12 DIAGNOSIS — E7801 Familial hypercholesterolemia: Secondary | ICD-10-CM | POA: Diagnosis not present

## 2021-12-12 DIAGNOSIS — E7849 Other hyperlipidemia: Secondary | ICD-10-CM | POA: Diagnosis not present

## 2021-12-13 DIAGNOSIS — Z0001 Encounter for general adult medical examination with abnormal findings: Secondary | ICD-10-CM | POA: Diagnosis not present

## 2021-12-13 DIAGNOSIS — Z6823 Body mass index (BMI) 23.0-23.9, adult: Secondary | ICD-10-CM | POA: Diagnosis not present

## 2021-12-13 DIAGNOSIS — E7849 Other hyperlipidemia: Secondary | ICD-10-CM | POA: Diagnosis not present

## 2022-01-01 DIAGNOSIS — I10 Essential (primary) hypertension: Secondary | ICD-10-CM | POA: Diagnosis not present

## 2022-01-01 DIAGNOSIS — E782 Mixed hyperlipidemia: Secondary | ICD-10-CM | POA: Diagnosis not present

## 2022-01-01 DIAGNOSIS — I214 Non-ST elevation (NSTEMI) myocardial infarction: Secondary | ICD-10-CM | POA: Diagnosis not present

## 2022-01-02 DIAGNOSIS — R7303 Prediabetes: Secondary | ICD-10-CM | POA: Diagnosis not present

## 2022-01-02 DIAGNOSIS — E8881 Metabolic syndrome: Secondary | ICD-10-CM | POA: Diagnosis not present

## 2022-01-02 DIAGNOSIS — Z6823 Body mass index (BMI) 23.0-23.9, adult: Secondary | ICD-10-CM | POA: Diagnosis not present

## 2022-01-02 DIAGNOSIS — I2510 Atherosclerotic heart disease of native coronary artery without angina pectoris: Secondary | ICD-10-CM | POA: Diagnosis not present

## 2022-01-02 DIAGNOSIS — E782 Mixed hyperlipidemia: Secondary | ICD-10-CM | POA: Diagnosis not present

## 2022-01-02 DIAGNOSIS — R7301 Impaired fasting glucose: Secondary | ICD-10-CM | POA: Diagnosis not present

## 2022-01-24 DIAGNOSIS — J00 Acute nasopharyngitis [common cold]: Secondary | ICD-10-CM | POA: Diagnosis not present

## 2022-02-07 DIAGNOSIS — H2513 Age-related nuclear cataract, bilateral: Secondary | ICD-10-CM | POA: Diagnosis not present

## 2022-02-07 DIAGNOSIS — H40013 Open angle with borderline findings, low risk, bilateral: Secondary | ICD-10-CM | POA: Diagnosis not present

## 2022-02-18 DIAGNOSIS — D485 Neoplasm of uncertain behavior of skin: Secondary | ICD-10-CM | POA: Diagnosis not present

## 2022-02-18 DIAGNOSIS — L821 Other seborrheic keratosis: Secondary | ICD-10-CM | POA: Diagnosis not present

## 2022-02-18 DIAGNOSIS — L570 Actinic keratosis: Secondary | ICD-10-CM | POA: Diagnosis not present

## 2022-03-19 DIAGNOSIS — H25811 Combined forms of age-related cataract, right eye: Secondary | ICD-10-CM | POA: Diagnosis not present

## 2022-03-19 DIAGNOSIS — H25812 Combined forms of age-related cataract, left eye: Secondary | ICD-10-CM | POA: Diagnosis not present

## 2022-03-19 DIAGNOSIS — H25813 Combined forms of age-related cataract, bilateral: Secondary | ICD-10-CM | POA: Diagnosis not present

## 2022-03-28 DIAGNOSIS — E7801 Familial hypercholesterolemia: Secondary | ICD-10-CM | POA: Diagnosis not present

## 2022-03-28 DIAGNOSIS — E782 Mixed hyperlipidemia: Secondary | ICD-10-CM | POA: Diagnosis not present

## 2022-03-28 DIAGNOSIS — E7800 Pure hypercholesterolemia, unspecified: Secondary | ICD-10-CM | POA: Diagnosis not present

## 2022-03-28 DIAGNOSIS — E7849 Other hyperlipidemia: Secondary | ICD-10-CM | POA: Diagnosis not present

## 2022-03-28 DIAGNOSIS — R739 Hyperglycemia, unspecified: Secondary | ICD-10-CM | POA: Diagnosis not present

## 2022-03-28 DIAGNOSIS — D72829 Elevated white blood cell count, unspecified: Secondary | ICD-10-CM | POA: Diagnosis not present

## 2022-03-28 DIAGNOSIS — R7303 Prediabetes: Secondary | ICD-10-CM | POA: Diagnosis not present

## 2022-04-03 DIAGNOSIS — E782 Mixed hyperlipidemia: Secondary | ICD-10-CM | POA: Diagnosis not present

## 2022-04-03 DIAGNOSIS — I10 Essential (primary) hypertension: Secondary | ICD-10-CM | POA: Diagnosis not present

## 2022-04-03 DIAGNOSIS — I25118 Atherosclerotic heart disease of native coronary artery with other forms of angina pectoris: Secondary | ICD-10-CM | POA: Diagnosis not present

## 2022-04-17 DIAGNOSIS — H269 Unspecified cataract: Secondary | ICD-10-CM | POA: Diagnosis not present

## 2022-04-17 DIAGNOSIS — H25811 Combined forms of age-related cataract, right eye: Secondary | ICD-10-CM | POA: Diagnosis not present

## 2022-05-02 DIAGNOSIS — R030 Elevated blood-pressure reading, without diagnosis of hypertension: Secondary | ICD-10-CM | POA: Diagnosis not present

## 2022-05-02 DIAGNOSIS — I2510 Atherosclerotic heart disease of native coronary artery without angina pectoris: Secondary | ICD-10-CM | POA: Diagnosis not present

## 2022-05-02 DIAGNOSIS — E7849 Other hyperlipidemia: Secondary | ICD-10-CM | POA: Diagnosis not present

## 2022-05-02 DIAGNOSIS — B0223 Postherpetic polyneuropathy: Secondary | ICD-10-CM | POA: Diagnosis not present

## 2022-05-02 DIAGNOSIS — R7303 Prediabetes: Secondary | ICD-10-CM | POA: Diagnosis not present

## 2022-05-02 DIAGNOSIS — Z6823 Body mass index (BMI) 23.0-23.9, adult: Secondary | ICD-10-CM | POA: Diagnosis not present

## 2022-05-02 DIAGNOSIS — R739 Hyperglycemia, unspecified: Secondary | ICD-10-CM | POA: Diagnosis not present

## 2022-05-29 DIAGNOSIS — H401113 Primary open-angle glaucoma, right eye, severe stage: Secondary | ICD-10-CM | POA: Diagnosis not present

## 2022-08-27 DIAGNOSIS — Z85828 Personal history of other malignant neoplasm of skin: Secondary | ICD-10-CM | POA: Diagnosis not present

## 2022-08-27 DIAGNOSIS — D239 Other benign neoplasm of skin, unspecified: Secondary | ICD-10-CM | POA: Diagnosis not present

## 2022-08-27 DIAGNOSIS — L570 Actinic keratosis: Secondary | ICD-10-CM | POA: Diagnosis not present

## 2022-10-01 DIAGNOSIS — I214 Non-ST elevation (NSTEMI) myocardial infarction: Secondary | ICD-10-CM | POA: Diagnosis not present

## 2022-10-01 DIAGNOSIS — I10 Essential (primary) hypertension: Secondary | ICD-10-CM | POA: Diagnosis not present

## 2022-10-01 DIAGNOSIS — E782 Mixed hyperlipidemia: Secondary | ICD-10-CM | POA: Diagnosis not present

## 2022-10-01 DIAGNOSIS — I25118 Atherosclerotic heart disease of native coronary artery with other forms of angina pectoris: Secondary | ICD-10-CM | POA: Diagnosis not present

## 2022-10-17 DIAGNOSIS — R7301 Impaired fasting glucose: Secondary | ICD-10-CM | POA: Diagnosis not present

## 2022-10-17 DIAGNOSIS — R739 Hyperglycemia, unspecified: Secondary | ICD-10-CM | POA: Diagnosis not present

## 2022-10-17 DIAGNOSIS — D72829 Elevated white blood cell count, unspecified: Secondary | ICD-10-CM | POA: Diagnosis not present

## 2022-10-17 DIAGNOSIS — E7801 Familial hypercholesterolemia: Secondary | ICD-10-CM | POA: Diagnosis not present

## 2022-10-17 DIAGNOSIS — E7849 Other hyperlipidemia: Secondary | ICD-10-CM | POA: Diagnosis not present

## 2022-10-22 ENCOUNTER — Encounter (INDEPENDENT_AMBULATORY_CARE_PROVIDER_SITE_OTHER): Payer: Self-pay | Admitting: *Deleted

## 2022-11-12 ENCOUNTER — Telehealth (INDEPENDENT_AMBULATORY_CARE_PROVIDER_SITE_OTHER): Payer: Self-pay | Admitting: Gastroenterology

## 2022-11-12 NOTE — Telephone Encounter (Signed)
Room 3, will need clearance to hold Plavix for 5 days (Dr. Rayetta Pigg Assar). Schedule after July 2024 Thanks

## 2022-11-12 NOTE — Telephone Encounter (Signed)
Who is your primary care physician: Dolores Lory  Reasons for the colonoscopy: Recall  Have you had a colonoscopy before?  Yes 12/10/17  Do you have family history of colon cancer? no  Previous colonoscopy with polyps removed? Yes 12/10/17  Do you have a history colorectal cancer?   no  Are you diabetic? If yes, Type 1 or Type 2?    no  Do you have a prosthetic or mechanical heart valve? no  Do you have a pacemaker/defibrillator?   no  Have you had endocarditis/atrial fibrillation? no  Have you had joint replacement within the last 12 months?  no  Do you tend to be constipated or have to use laxatives? no  Do you have any history of drugs or alchohol?  no  Do you use supplemental oxygen?  no  Have you had a stroke or heart attack within the last 6 months? no  Do you take weight loss medication?  no  Do you take any blood-thinning medications such as: (aspirin, warfarin, Plavix, Aggrenox)  yes  If yes we need the name, milligram, dosage and who is prescribing doctor Aspirin 81 mg every day/clopidogrel 75mg  every day Dr.Sasser Current Outpatient Medications on File Prior to Visit  Medication Sig Dispense Refill   acetaminophen (TYLENOL) 500 MG tablet Take 500 mg by mouth every 6 (six) hours as needed.     amitriptyline (ELAVIL) 10 MG tablet Take 2 tablets (20 mg total) by mouth at bedtime. 180 tablet 0   aspirin EC 81 MG tablet Take 81 mg by mouth daily. Swallow whole.     clopidogrel (PLAVIX) 75 MG tablet Take 75 mg by mouth daily.     DULoxetine (CYMBALTA) 30 MG capsule Take 30 mg by mouth 2 (two) times daily.     famotidine (PEPCID) 20 MG tablet Take 20 mg by mouth at bedtime.     gabapentin (NEURONTIN) 800 MG tablet Take 1 tablet (800 mg total) by mouth 3 (three) times daily. 270 tablet 0   ibuprofen (ADVIL) 200 MG tablet Take 200 mg by mouth 2 (two) times daily.     Lidocaine 4 % LOTN Apply 1 g topically 3 (three) times daily as needed. (Patient taking differently: Apply 1  g topically 3 (three) times daily as needed (pain from shingles on neck).) 1 Bottle 5   loratadine (CLARITIN) 10 MG tablet Take 10 mg by mouth daily.     Multiple Vitamins-Minerals (MULTIVITAMIN ADULTS PO) Take 1 tablet by mouth daily.     nitroGLYCERIN (NITROSTAT) 0.4 MG SL tablet Place 1 tablet (0.4 mg total) under the tongue every 5 (five) minutes as needed. 25 tablet 3   Omega-3 Fatty Acids (FISH OIL) 1000 MG CAPS Take 1 capsule by mouth daily.     rosuvastatin (CRESTOR) 40 MG tablet Take 10 mg by mouth at bedtime.   4   No current facility-administered medications on file prior to visit.    No Known Allergies   Pharmacy: [facility]  Primary Insurance Name: Wyn Quaker number where you can be reached: [phone]       [phone]

## 2022-11-14 ENCOUNTER — Telehealth (INDEPENDENT_AMBULATORY_CARE_PROVIDER_SITE_OTHER): Payer: Self-pay | Admitting: Gastroenterology

## 2022-11-14 DIAGNOSIS — R030 Elevated blood-pressure reading, without diagnosis of hypertension: Secondary | ICD-10-CM | POA: Diagnosis not present

## 2022-11-14 DIAGNOSIS — R739 Hyperglycemia, unspecified: Secondary | ICD-10-CM | POA: Diagnosis not present

## 2022-11-14 DIAGNOSIS — B0223 Postherpetic polyneuropathy: Secondary | ICD-10-CM | POA: Diagnosis not present

## 2022-11-14 DIAGNOSIS — I2510 Atherosclerotic heart disease of native coronary artery without angina pectoris: Secondary | ICD-10-CM | POA: Diagnosis not present

## 2022-11-14 DIAGNOSIS — E7849 Other hyperlipidemia: Secondary | ICD-10-CM | POA: Diagnosis not present

## 2022-11-14 DIAGNOSIS — Z6823 Body mass index (BMI) 23.0-23.9, adult: Secondary | ICD-10-CM | POA: Diagnosis not present

## 2022-11-14 DIAGNOSIS — R7303 Prediabetes: Secondary | ICD-10-CM | POA: Diagnosis not present

## 2022-11-14 NOTE — Telephone Encounter (Signed)
    11/14/22  Rose Phi [birth year]  What type of surgery is being performed? Colonoscopy  When is surgery scheduled? TBD  Clearance to hold Plavix for 5 days  Name of physician performing surgery?  Dr. Katrinka Blazing [facility] at [facility] Phone: [phone] Fax: [phone]  Anethesia type (none, local, MAC, general)? MAC

## 2022-11-14 NOTE — Telephone Encounter (Signed)
Medicaiton clearance sent to Dr.Assar. Will place in August folder

## 2022-12-17 DIAGNOSIS — H401113 Primary open-angle glaucoma, right eye, severe stage: Secondary | ICD-10-CM | POA: Diagnosis not present

## 2022-12-19 DIAGNOSIS — E7849 Other hyperlipidemia: Secondary | ICD-10-CM | POA: Diagnosis not present

## 2022-12-19 DIAGNOSIS — Z0001 Encounter for general adult medical examination with abnormal findings: Secondary | ICD-10-CM | POA: Diagnosis not present

## 2022-12-19 DIAGNOSIS — I2510 Atherosclerotic heart disease of native coronary artery without angina pectoris: Secondary | ICD-10-CM | POA: Diagnosis not present

## 2022-12-19 DIAGNOSIS — F17210 Nicotine dependence, cigarettes, uncomplicated: Secondary | ICD-10-CM | POA: Diagnosis not present

## 2023-02-24 DIAGNOSIS — R300 Dysuria: Secondary | ICD-10-CM | POA: Diagnosis not present

## 2023-02-24 DIAGNOSIS — N390 Urinary tract infection, site not specified: Secondary | ICD-10-CM | POA: Diagnosis not present

## 2023-03-12 MED ORDER — PEG 3350-KCL-NA BICARB-NACL 420 G PO SOLR: 4000.0000 mL | Freq: Once | ORAL | 0 refills | Status: AC

## 2023-03-12 NOTE — Addendum Note (Signed)
Addended by: Marlowe Shores on: 03/12/2023 03:55 PM   Modules accepted: Orders

## 2023-03-12 NOTE — Telephone Encounter (Signed)
Pt wife left message inquiring about TCS questionnaire.  Returned call to pt wife and advised her we were waiting on clearance to hold plavix. Wife states pt is no longer on Plavix. Only taking Crestor. Scheduled pt for 03/31/23. Will send instructions once pre op is received. Prep sent to pharmacy. PA approved via [employer]

## 2023-03-25 ENCOUNTER — Telehealth (INDEPENDENT_AMBULATORY_CARE_PROVIDER_SITE_OTHER): Payer: Self-pay | Admitting: Gastroenterology

## 2023-03-25 DIAGNOSIS — L821 Other seborrheic keratosis: Secondary | ICD-10-CM | POA: Diagnosis not present

## 2023-03-25 DIAGNOSIS — L570 Actinic keratosis: Secondary | ICD-10-CM | POA: Diagnosis not present

## 2023-03-25 DIAGNOSIS — Z85828 Personal history of other malignant neoplasm of skin: Secondary | ICD-10-CM | POA: Diagnosis not present

## 2023-03-25 NOTE — Telephone Encounter (Signed)
Patient's wife LM that he wants to cancel.  I have placed him in the depot.  Please let me know.   Thanks,   Contacted pt wife and pt wife states that pt would like to cancel. Pt wife states he is 11 and not having any issues and doesn't want to drink that gallon jug.

## 2023-03-26 ENCOUNTER — Encounter (HOSPITAL_COMMUNITY): Payer: Medicare HMO

## 2023-03-31 ENCOUNTER — Encounter (HOSPITAL_COMMUNITY): Admission: RE | Payer: Self-pay | Source: Home / Self Care

## 2023-03-31 ENCOUNTER — Ambulatory Visit (HOSPITAL_COMMUNITY): Admission: RE | Admit: 2023-03-31 | Payer: Medicare HMO | Source: Home / Self Care | Admitting: Gastroenterology

## 2023-03-31 SURGERY — COLONOSCOPY WITH PROPOFOL
Anesthesia: Monitor Anesthesia Care

## 2023-04-02 DIAGNOSIS — H401113 Primary open-angle glaucoma, right eye, severe stage: Secondary | ICD-10-CM | POA: Diagnosis not present

## 2023-04-02 DIAGNOSIS — H2513 Age-related nuclear cataract, bilateral: Secondary | ICD-10-CM | POA: Diagnosis not present

## 2023-04-03 DIAGNOSIS — E782 Mixed hyperlipidemia: Secondary | ICD-10-CM | POA: Diagnosis not present

## 2023-04-03 DIAGNOSIS — I25118 Atherosclerotic heart disease of native coronary artery with other forms of angina pectoris: Secondary | ICD-10-CM | POA: Diagnosis not present

## 2023-04-03 DIAGNOSIS — I10 Essential (primary) hypertension: Secondary | ICD-10-CM | POA: Diagnosis not present

## 2023-04-15 DIAGNOSIS — Z01818 Encounter for other preprocedural examination: Secondary | ICD-10-CM | POA: Diagnosis not present

## 2023-04-15 DIAGNOSIS — H401122 Primary open-angle glaucoma, left eye, moderate stage: Secondary | ICD-10-CM | POA: Diagnosis not present

## 2023-04-15 DIAGNOSIS — H25812 Combined forms of age-related cataract, left eye: Secondary | ICD-10-CM | POA: Diagnosis not present

## 2023-05-05 DIAGNOSIS — I10 Essential (primary) hypertension: Secondary | ICD-10-CM | POA: Diagnosis not present

## 2023-05-05 DIAGNOSIS — H401122 Primary open-angle glaucoma, left eye, moderate stage: Secondary | ICD-10-CM | POA: Diagnosis not present

## 2023-05-05 DIAGNOSIS — H25812 Combined forms of age-related cataract, left eye: Secondary | ICD-10-CM | POA: Diagnosis not present

## 2023-05-12 DIAGNOSIS — B0223 Postherpetic polyneuropathy: Secondary | ICD-10-CM | POA: Diagnosis not present

## 2023-05-12 DIAGNOSIS — E7849 Other hyperlipidemia: Secondary | ICD-10-CM | POA: Diagnosis not present

## 2023-05-12 DIAGNOSIS — R109 Unspecified abdominal pain: Secondary | ICD-10-CM | POA: Diagnosis not present

## 2023-05-12 DIAGNOSIS — R030 Elevated blood-pressure reading, without diagnosis of hypertension: Secondary | ICD-10-CM | POA: Diagnosis not present

## 2023-05-12 DIAGNOSIS — I2510 Atherosclerotic heart disease of native coronary artery without angina pectoris: Secondary | ICD-10-CM | POA: Diagnosis not present

## 2023-05-12 DIAGNOSIS — Z6824 Body mass index (BMI) 24.0-24.9, adult: Secondary | ICD-10-CM | POA: Diagnosis not present

## 2023-05-12 DIAGNOSIS — R7303 Prediabetes: Secondary | ICD-10-CM | POA: Diagnosis not present

## 2023-05-12 DIAGNOSIS — R739 Hyperglycemia, unspecified: Secondary | ICD-10-CM | POA: Diagnosis not present

## 2023-08-18 DIAGNOSIS — R030 Elevated blood-pressure reading, without diagnosis of hypertension: Secondary | ICD-10-CM | POA: Diagnosis not present

## 2023-08-18 DIAGNOSIS — N390 Urinary tract infection, site not specified: Secondary | ICD-10-CM | POA: Diagnosis not present

## 2023-09-12 DIAGNOSIS — H04129 Dry eye syndrome of unspecified lacrimal gland: Secondary | ICD-10-CM | POA: Diagnosis not present

## 2023-09-12 DIAGNOSIS — H401113 Primary open-angle glaucoma, right eye, severe stage: Secondary | ICD-10-CM | POA: Diagnosis not present

## 2023-09-22 DIAGNOSIS — H524 Presbyopia: Secondary | ICD-10-CM | POA: Diagnosis not present

## 2023-10-03 DIAGNOSIS — I10 Essential (primary) hypertension: Secondary | ICD-10-CM | POA: Diagnosis not present

## 2023-11-03 DIAGNOSIS — R7303 Prediabetes: Secondary | ICD-10-CM | POA: Diagnosis not present

## 2023-11-03 DIAGNOSIS — D72829 Elevated white blood cell count, unspecified: Secondary | ICD-10-CM | POA: Diagnosis not present

## 2023-11-03 DIAGNOSIS — E7849 Other hyperlipidemia: Secondary | ICD-10-CM | POA: Diagnosis not present

## 2023-11-10 DIAGNOSIS — E782 Mixed hyperlipidemia: Secondary | ICD-10-CM | POA: Diagnosis not present

## 2023-11-10 DIAGNOSIS — I2510 Atherosclerotic heart disease of native coronary artery without angina pectoris: Secondary | ICD-10-CM | POA: Diagnosis not present

## 2023-11-10 DIAGNOSIS — B0229 Other postherpetic nervous system involvement: Secondary | ICD-10-CM | POA: Diagnosis not present

## 2023-11-10 DIAGNOSIS — Z23 Encounter for immunization: Secondary | ICD-10-CM | POA: Diagnosis not present

## 2023-11-10 DIAGNOSIS — Z6823 Body mass index (BMI) 23.0-23.9, adult: Secondary | ICD-10-CM | POA: Diagnosis not present

## 2023-11-10 DIAGNOSIS — E1159 Type 2 diabetes mellitus with other circulatory complications: Secondary | ICD-10-CM | POA: Diagnosis not present

## 2023-11-13 ENCOUNTER — Ambulatory Visit (INDEPENDENT_AMBULATORY_CARE_PROVIDER_SITE_OTHER): Admitting: Otolaryngology

## 2023-11-13 ENCOUNTER — Encounter (INDEPENDENT_AMBULATORY_CARE_PROVIDER_SITE_OTHER): Payer: Self-pay | Admitting: Otolaryngology

## 2023-11-13 VITALS — BP 121/57 | HR 71

## 2023-11-13 DIAGNOSIS — Z87891 Personal history of nicotine dependence: Secondary | ICD-10-CM

## 2023-11-13 DIAGNOSIS — R490 Dysphonia: Secondary | ICD-10-CM

## 2023-11-13 DIAGNOSIS — K219 Gastro-esophageal reflux disease without esophagitis: Secondary | ICD-10-CM | POA: Diagnosis not present

## 2023-11-13 DIAGNOSIS — J383 Other diseases of vocal cords: Secondary | ICD-10-CM

## 2023-11-13 DIAGNOSIS — R070 Pain in throat: Secondary | ICD-10-CM

## 2023-11-13 NOTE — Patient Instructions (Signed)

## 2023-11-13 NOTE — Progress Notes (Signed)
 ENT CONSULT:  Reason for Consult: dysphonia  right sided throat discomfort  HPI: Discussed the use of AI scribe software for clinical note transcription with the patient, who gave verbal consent to proceed.  History of Present Illness Seth Higgins is an 83 year old male who presents with voice changes and throat discomfort on the right side.  He has experienced voice changes characterized by hoarseness for the past three to four years. No preceding illness, laryngitis, or surgeries with intubation preceded the onset of symptoms. No dysphagia or dyspnea.  He reports a sensation of choking when bending over, which he finds concerning. Additionally, he experiences a dull pain on the right side of his throat, particularly in the upper region. No jaw-related pain is present, and the discomfort is localized to his throat.  He mentions a past prescription of famotidine (Pepcid) at night, provided by a previous doctor, to manage reflux symptoms.   Past Medical History:  Diagnosis Date   CAD (coronary artery disease)    NSTEMI/DES RCA, 05/11. EF 50%,inferoapical HK   Cancer (HCC) 1999   Colon Cancer   Dyslipidemia    History of colon cancer    pre-cancer   Post herpetic neuralgia 10/17/2016   Left C2-3    Past Surgical History:  Procedure Laterality Date   CARDIAC CATHETERIZATION  09/2009   with stent placement   COLON SURGERY  1999   COLONOSCOPY [name] 07/01/2012   Procedure: COLONOSCOPY;  Surgeon: [name], MD;  [facility];  Service: Endoscopy;  Laterality: [name];  830   COLONOSCOPY [name] 12/10/2017   Procedure: COLONOSCOPY;  Surgeon: [name], MD;  [facility];  Service: Endoscopy;  Laterality: [name];  830   HERNIA REPAIR     LEFT   MAXILLARY ANTROSTOMY Right 05/18/2018   Procedure: RIGHT MAXILLARY ANTROSTOMY WITH TISSUE REMOVAL;  Surgeon: [name], MD;  Location: [facility];  Service: ENT;  Laterality: Right;   NASAL SEPTOPLASTY W/  TURBINOPLASTY [name] 05/18/2018   Procedure: NASAL SEPTOPLASTY WITH TURBINATE REDUCTION;  Surgeon: [name], MD;  Location: [facility];  Service: ENT;  Laterality: [name];   POLYPECTOMY  12/10/2017   Procedure: POLYPECTOMY;  Surgeon: [name], MD;  [facility];  Service: Endoscopy;;  colon   SEPTOPLASTY WITH ETHMOIDECTOMY, AND MAXILLARY ANTROSTOMY Right 05/18/2018   Procedure: SEPTOPLASTY WITH RIGHT ETHMOIDECTOMY AND SPHENOIDECTOMY WITH TISSUE REMOVAL;  Surgeon: [name], MD;  Location: [facility];  Service: ENT;  Laterality: Right;   SINUS ENDO W/FUSION Right 05/18/2018   Procedure: ENDOSCOPIC SINUS SURGERY WITH NAVIGATION;  Surgeon: [name], MD;  Location: [facility];  Service: ENT;  Laterality: Right;    Family History  Problem Relation Age of Onset   Coronary artery disease Father        had CABG in 1970   Colon cancer Father     Social History:  reports that he quit smoking about 55 years ago. His smoking use included cigarettes. He started smoking about 65 years ago. He has a 10 pack-year smoking history. He has never used smokeless tobacco. He reports that he does not drink alcohol  and does not use drugs.  Allergies: No Known Allergies  Medications: I have reviewed the patient's current medications.  The PMH, PSH, Medications, Allergies, and SH were reviewed and updated.  ROS: Constitutional: Negative for fever, weight loss and weight gain. Cardiovascular: Negative for chest pain and dyspnea on  exertion. Respiratory: Is not experiencing shortness of breath at rest. Gastrointestinal: Negative for nausea and vomiting. Neurological: Negative for headaches. Psychiatric: The patient is not nervous/anxious  There were no vitals taken for this visit. There is no height or weight on file to calculate BMI.  PHYSICAL EXAM:  Exam: General: Well-developed, well-nourished Communication and Voice:  raspy Respiratory Respiratory effort: Equal inspiration and expiration without stridor Cardiovascular Peripheral Vascular: Warm extremities with equal color/perfusion Eyes: No nystagmus with equal extraocular motion bilaterally Neuro/Psych/Balance: Patient oriented to person, place, and time; Appropriate mood and affect; Gait is intact with no imbalance; Cranial nerves I-XII are intact Head and Face Inspection: Normocephalic and atraumatic without mass or lesion Palpation: Facial skeleton intact without bony stepoffs Salivary Glands: No mass or tenderness Facial Strength: Facial motility symmetric and full bilaterally ENT Pinna: External ear intact and fully developed External canal: Canal is patent with intact skin Tympanic Membrane: Clear and mobile External Nose: No scar or anatomic deformity Internal Nose: Septum is deviated to the left. No polyp, or purulence. Mucosal edema and erythema present.  Bilateral inferior turbinate hypertrophy.  Lips, Teeth, and gums: Mucosa and teeth intact and viable TMJ: No pain to palpation with full mobility Oral cavity/oropharynx: No erythema or exudate, no lesions present Nasopharynx: No mass or lesion with intact mucosa Hypopharynx: Intact mucosa without pooling of secretions Larynx Glottic: Full true vocal cord mobility without lesion or mass VF atrophy Supraglottic: Normal appearing epiglottis and AE folds Interarytenoid Space: Moderate pachydermia&edema Subglottic Space: Patent without lesion or edema Neck Neck and Trachea: Midline trachea without mass or lesion Thyroid: No mass or nodularity Lymphatics: No lymphadenopathy  Procedure: Preoperative diagnosis: dysphonia   Postoperative diagnosis:   Same + VF atrophy and glottic insufficiency   Procedure: Flexible fiberoptic laryngoscopy  Surgeon: Elena Larry, MD  Anesthesia: Topical lidocaine  and Afrin Complications: None Condition is stable throughout exam  Indications and  consent:  The patient presents to the clinic with above symptoms. Indirect laryngoscopy view was incomplete. Thus it was recommended that they undergo a flexible fiberoptic laryngoscopy. All of the risks, benefits, and potential complications were reviewed with the patient preoperatively and verbal informed consent was obtained.  Procedure: The patient was seated upright in the clinic. Topical lidocaine  and Afrin were applied to the nasal cavity. After adequate anesthesia had occurred, I then proceeded to pass the flexible telescope into the nasal cavity. The nasal cavity was patent without rhinorrhea or polyp. The nasopharynx was also patent without mass or lesion. The base of tongue was visualized and was normal. There were no signs of pooling of secretions in the piriform sinuses. The true vocal folds were mobile bilaterally. There were no signs of glottic or supraglottic mucosal lesion or mass. There was moderate interarytenoid pachydermia and post cricoid edema. The telescope was then slowly withdrawn and the patient tolerated the procedure throughout.      Studies Reviewed: Max face CT 02/05/2018 IMPRESSION: 1. Right maxillary antrostomy since 2016 for treatment of right maxillary mucocele. Moderate residual circumferential mucoperiosteal thickening. Superimposed acute rhinitis suspected. 2. Mild mucosal thickening along the floor of the hyperplastic right sphenoid sinus plus a new 9 mm focus of calcified mucosa/retention cyst/loose body (series 3, image 51). 3. Other paranasal sinuses remain well pneumatized. Note sphenoid and frontal sinus hyperplasia.  Assessment/Plan: No diagnosis found.  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 Vocal cords thin on scope exam, consistent with age-related  changes. No lesions or masses. - Recommend voice therapy with a speech therapist to optimize breath support  and improve voice quality. - Consider procedural interventions if voice therapy is ineffective.  Throat pain, discomfort Right side occasional Dull right-sided throat discomfort. No abnormalities on examination including scope today, no palpable neck masses. Possible neck-related or referred pain. Low suspicion for deep tissue pathology. We discussed CT neck but he would like to observe - medical management of GERD since this pain could be globus sensation  - will consider CT neck in the future if sx persist.   Gastroesophageal reflux disease (GERD) Choking sensation when bending over, possibly related to GERD. Currently on famotidine at night. Discussed reflux control optimization. - Provide handout for a natural supplement Reflux Gourmet, to be taken after meals for reflux control. - continue Pepcid.  - Provide instructions on lifestyle modifications to manage reflux, included in the after visit summary.  Speech therapy referral     Thank you for allowing me to participate in the care of this patient. Please do not hesitate to contact me with any questions or concerns.   Elena Larry, MD Otolaryngology [facility] Phone: [phone] Fax: [phone]    11/13/2023, 11:07 AM

## 2023-12-12 DIAGNOSIS — I10 Essential (primary) hypertension: Secondary | ICD-10-CM | POA: Diagnosis not present

## 2023-12-12 DIAGNOSIS — R0602 Shortness of breath: Secondary | ICD-10-CM | POA: Diagnosis not present

## 2023-12-12 DIAGNOSIS — I69354 Hemiplegia and hemiparesis following cerebral infarction affecting left non-dominant side: Secondary | ICD-10-CM | POA: Diagnosis not present

## 2023-12-12 DIAGNOSIS — D649 Anemia, unspecified: Secondary | ICD-10-CM | POA: Diagnosis not present

## 2023-12-12 DIAGNOSIS — I4891 Unspecified atrial fibrillation: Secondary | ICD-10-CM | POA: Diagnosis not present

## 2023-12-12 DIAGNOSIS — X58XXXA Exposure to other specified factors, initial encounter: Secondary | ICD-10-CM | POA: Diagnosis not present

## 2023-12-12 DIAGNOSIS — J9601 Acute respiratory failure with hypoxia: Secondary | ICD-10-CM | POA: Diagnosis not present

## 2023-12-12 DIAGNOSIS — Z792 Long term (current) use of antibiotics: Secondary | ICD-10-CM | POA: Diagnosis not present

## 2023-12-12 DIAGNOSIS — I629 Nontraumatic intracranial hemorrhage, unspecified: Secondary | ICD-10-CM | POA: Diagnosis not present

## 2023-12-12 DIAGNOSIS — G936 Cerebral edema: Secondary | ICD-10-CM | POA: Diagnosis not present

## 2023-12-12 DIAGNOSIS — G459 Transient cerebral ischemic attack, unspecified: Secondary | ICD-10-CM | POA: Diagnosis not present

## 2023-12-12 DIAGNOSIS — Z7982 Long term (current) use of aspirin: Secondary | ICD-10-CM | POA: Diagnosis not present

## 2023-12-12 DIAGNOSIS — Z6823 Body mass index (BMI) 23.0-23.9, adult: Secondary | ICD-10-CM | POA: Diagnosis not present

## 2023-12-12 DIAGNOSIS — S82252A Displaced comminuted fracture of shaft of left tibia, initial encounter for closed fracture: Secondary | ICD-10-CM | POA: Diagnosis not present

## 2023-12-12 DIAGNOSIS — S82252B Displaced comminuted fracture of shaft of left tibia, initial encounter for open fracture type I or II: Secondary | ICD-10-CM | POA: Diagnosis not present

## 2023-12-12 DIAGNOSIS — I6389 Other cerebral infarction: Secondary | ICD-10-CM | POA: Diagnosis not present

## 2023-12-12 DIAGNOSIS — S81802A Unspecified open wound, left lower leg, initial encounter: Secondary | ICD-10-CM | POA: Diagnosis not present

## 2023-12-12 DIAGNOSIS — R509 Fever, unspecified: Secondary | ICD-10-CM | POA: Diagnosis not present

## 2023-12-12 DIAGNOSIS — I480 Paroxysmal atrial fibrillation: Secondary | ICD-10-CM | POA: Diagnosis not present

## 2023-12-12 DIAGNOSIS — F05 Delirium due to known physiological condition: Secondary | ICD-10-CM | POA: Diagnosis not present

## 2023-12-12 DIAGNOSIS — N3000 Acute cystitis without hematuria: Secondary | ICD-10-CM | POA: Diagnosis not present

## 2023-12-12 DIAGNOSIS — W3400XA Accidental discharge from unspecified firearms or gun, initial encounter: Secondary | ICD-10-CM | POA: Diagnosis not present

## 2023-12-12 DIAGNOSIS — R531 Weakness: Secondary | ICD-10-CM | POA: Diagnosis not present

## 2023-12-12 DIAGNOSIS — Z4789 Encounter for other orthopedic aftercare: Secondary | ICD-10-CM | POA: Diagnosis not present

## 2023-12-12 DIAGNOSIS — Z66 Do not resuscitate: Secondary | ICD-10-CM | POA: Diagnosis not present

## 2023-12-12 DIAGNOSIS — J690 Pneumonitis due to inhalation of food and vomit: Secondary | ICD-10-CM | POA: Diagnosis not present

## 2023-12-12 DIAGNOSIS — R0902 Hypoxemia: Secondary | ICD-10-CM | POA: Diagnosis not present

## 2023-12-12 DIAGNOSIS — D62 Acute posthemorrhagic anemia: Secondary | ICD-10-CM | POA: Diagnosis not present

## 2023-12-12 DIAGNOSIS — S82251A Displaced comminuted fracture of shaft of right tibia, initial encounter for closed fracture: Secondary | ICD-10-CM | POA: Diagnosis not present

## 2023-12-12 DIAGNOSIS — Z79899 Other long term (current) drug therapy: Secondary | ICD-10-CM | POA: Diagnosis not present

## 2023-12-12 DIAGNOSIS — I69314 Frontal lobe and executive function deficit following cerebral infarction: Secondary | ICD-10-CM | POA: Diagnosis not present

## 2023-12-12 DIAGNOSIS — R1310 Dysphagia, unspecified: Secondary | ICD-10-CM | POA: Diagnosis not present

## 2023-12-12 DIAGNOSIS — Z7901 Long term (current) use of anticoagulants: Secondary | ICD-10-CM | POA: Diagnosis not present

## 2023-12-12 DIAGNOSIS — I63531 Cerebral infarction due to unspecified occlusion or stenosis of right posterior cerebral artery: Secondary | ICD-10-CM | POA: Diagnosis not present

## 2023-12-12 DIAGNOSIS — E785 Hyperlipidemia, unspecified: Secondary | ICD-10-CM | POA: Diagnosis not present

## 2023-12-12 DIAGNOSIS — Z01818 Encounter for other preprocedural examination: Secondary | ICD-10-CM | POA: Diagnosis not present

## 2023-12-12 DIAGNOSIS — G8324 Monoplegia of upper limb affecting left nondominant side: Secondary | ICD-10-CM | POA: Diagnosis not present

## 2023-12-12 DIAGNOSIS — R29810 Facial weakness: Secondary | ICD-10-CM | POA: Diagnosis not present

## 2023-12-12 DIAGNOSIS — J984 Other disorders of lung: Secondary | ICD-10-CM | POA: Diagnosis not present

## 2023-12-12 DIAGNOSIS — Z452 Encounter for adjustment and management of vascular access device: Secondary | ICD-10-CM | POA: Diagnosis not present

## 2023-12-12 DIAGNOSIS — I63511 Cerebral infarction due to unspecified occlusion or stenosis of right middle cerebral artery: Secondary | ICD-10-CM | POA: Diagnosis not present

## 2023-12-12 DIAGNOSIS — R0989 Other specified symptoms and signs involving the circulatory and respiratory systems: Secondary | ICD-10-CM | POA: Diagnosis not present

## 2023-12-12 DIAGNOSIS — I639 Cerebral infarction, unspecified: Secondary | ICD-10-CM | POA: Diagnosis not present

## 2023-12-12 DIAGNOSIS — I708 Atherosclerosis of other arteries: Secondary | ICD-10-CM | POA: Diagnosis not present

## 2023-12-12 DIAGNOSIS — R918 Other nonspecific abnormal finding of lung field: Secondary | ICD-10-CM | POA: Diagnosis not present

## 2023-12-12 DIAGNOSIS — R300 Dysuria: Secondary | ICD-10-CM | POA: Diagnosis not present

## 2023-12-12 DIAGNOSIS — I63521 Cerebral infarction due to unspecified occlusion or stenosis of right anterior cerebral artery: Secondary | ICD-10-CM | POA: Diagnosis not present

## 2023-12-12 DIAGNOSIS — S82253A Displaced comminuted fracture of shaft of unspecified tibia, initial encounter for closed fracture: Secondary | ICD-10-CM | POA: Diagnosis not present

## 2023-12-12 DIAGNOSIS — I2510 Atherosclerotic heart disease of native coronary artery without angina pectoris: Secondary | ICD-10-CM | POA: Diagnosis not present

## 2023-12-12 DIAGNOSIS — R410 Disorientation, unspecified: Secondary | ICD-10-CM | POA: Diagnosis not present

## 2023-12-12 DIAGNOSIS — I499 Cardiac arrhythmia, unspecified: Secondary | ICD-10-CM | POA: Diagnosis not present

## 2023-12-13 DIAGNOSIS — S81802A Unspecified open wound, left lower leg, initial encounter: Secondary | ICD-10-CM | POA: Diagnosis not present

## 2023-12-13 DIAGNOSIS — S82252A Displaced comminuted fracture of shaft of left tibia, initial encounter for closed fracture: Secondary | ICD-10-CM | POA: Diagnosis not present

## 2023-12-13 DIAGNOSIS — I2510 Atherosclerotic heart disease of native coronary artery without angina pectoris: Secondary | ICD-10-CM | POA: Diagnosis not present

## 2023-12-13 DIAGNOSIS — Z01818 Encounter for other preprocedural examination: Secondary | ICD-10-CM | POA: Diagnosis not present

## 2023-12-13 DIAGNOSIS — Z4789 Encounter for other orthopedic aftercare: Secondary | ICD-10-CM | POA: Diagnosis not present

## 2023-12-13 DIAGNOSIS — N3000 Acute cystitis without hematuria: Secondary | ICD-10-CM | POA: Diagnosis not present

## 2023-12-14 DIAGNOSIS — I708 Atherosclerosis of other arteries: Secondary | ICD-10-CM | POA: Diagnosis not present

## 2023-12-14 DIAGNOSIS — R531 Weakness: Secondary | ICD-10-CM | POA: Diagnosis not present

## 2023-12-14 DIAGNOSIS — R29810 Facial weakness: Secondary | ICD-10-CM | POA: Diagnosis not present

## 2023-12-14 DIAGNOSIS — G8324 Monoplegia of upper limb affecting left nondominant side: Secondary | ICD-10-CM | POA: Diagnosis not present

## 2023-12-15 DIAGNOSIS — I4891 Unspecified atrial fibrillation: Secondary | ICD-10-CM | POA: Diagnosis not present

## 2023-12-15 DIAGNOSIS — I499 Cardiac arrhythmia, unspecified: Secondary | ICD-10-CM | POA: Diagnosis not present

## 2023-12-15 DIAGNOSIS — Z7982 Long term (current) use of aspirin: Secondary | ICD-10-CM | POA: Diagnosis not present

## 2023-12-15 DIAGNOSIS — I2510 Atherosclerotic heart disease of native coronary artery without angina pectoris: Secondary | ICD-10-CM | POA: Diagnosis not present

## 2023-12-15 DIAGNOSIS — N3000 Acute cystitis without hematuria: Secondary | ICD-10-CM | POA: Diagnosis not present

## 2023-12-15 DIAGNOSIS — I69314 Frontal lobe and executive function deficit following cerebral infarction: Secondary | ICD-10-CM | POA: Diagnosis not present

## 2023-12-15 DIAGNOSIS — I6389 Other cerebral infarction: Secondary | ICD-10-CM | POA: Diagnosis not present

## 2023-12-15 DIAGNOSIS — S82253A Displaced comminuted fracture of shaft of unspecified tibia, initial encounter for closed fracture: Secondary | ICD-10-CM | POA: Diagnosis not present

## 2023-12-15 DIAGNOSIS — E785 Hyperlipidemia, unspecified: Secondary | ICD-10-CM | POA: Diagnosis not present

## 2023-12-15 DIAGNOSIS — Z79899 Other long term (current) drug therapy: Secondary | ICD-10-CM | POA: Diagnosis not present

## 2023-12-15 DIAGNOSIS — R509 Fever, unspecified: Secondary | ICD-10-CM | POA: Diagnosis not present

## 2023-12-15 DIAGNOSIS — I10 Essential (primary) hypertension: Secondary | ICD-10-CM | POA: Diagnosis not present

## 2023-12-15 DIAGNOSIS — R410 Disorientation, unspecified: Secondary | ICD-10-CM | POA: Diagnosis not present

## 2023-12-16 DIAGNOSIS — I639 Cerebral infarction, unspecified: Secondary | ICD-10-CM | POA: Diagnosis not present

## 2023-12-16 DIAGNOSIS — R0989 Other specified symptoms and signs involving the circulatory and respiratory systems: Secondary | ICD-10-CM | POA: Diagnosis not present

## 2023-12-16 DIAGNOSIS — S82251A Displaced comminuted fracture of shaft of right tibia, initial encounter for closed fracture: Secondary | ICD-10-CM | POA: Diagnosis not present

## 2023-12-16 DIAGNOSIS — I499 Cardiac arrhythmia, unspecified: Secondary | ICD-10-CM | POA: Diagnosis not present

## 2023-12-16 DIAGNOSIS — I2510 Atherosclerotic heart disease of native coronary artery without angina pectoris: Secondary | ICD-10-CM | POA: Diagnosis not present

## 2023-12-16 DIAGNOSIS — R509 Fever, unspecified: Secondary | ICD-10-CM | POA: Diagnosis not present

## 2023-12-16 DIAGNOSIS — I4891 Unspecified atrial fibrillation: Secondary | ICD-10-CM | POA: Diagnosis not present

## 2023-12-16 DIAGNOSIS — N3000 Acute cystitis without hematuria: Secondary | ICD-10-CM | POA: Diagnosis not present

## 2023-12-16 DIAGNOSIS — R918 Other nonspecific abnormal finding of lung field: Secondary | ICD-10-CM | POA: Diagnosis not present

## 2023-12-16 DIAGNOSIS — I10 Essential (primary) hypertension: Secondary | ICD-10-CM | POA: Diagnosis not present

## 2023-12-16 DIAGNOSIS — W3400XA Accidental discharge from unspecified firearms or gun, initial encounter: Secondary | ICD-10-CM | POA: Diagnosis not present

## 2023-12-16 DIAGNOSIS — I69314 Frontal lobe and executive function deficit following cerebral infarction: Secondary | ICD-10-CM | POA: Diagnosis not present

## 2023-12-16 DIAGNOSIS — Z79899 Other long term (current) drug therapy: Secondary | ICD-10-CM | POA: Diagnosis not present

## 2023-12-16 DIAGNOSIS — R410 Disorientation, unspecified: Secondary | ICD-10-CM | POA: Diagnosis not present

## 2023-12-16 DIAGNOSIS — E785 Hyperlipidemia, unspecified: Secondary | ICD-10-CM | POA: Diagnosis not present

## 2023-12-16 DIAGNOSIS — G459 Transient cerebral ischemic attack, unspecified: Secondary | ICD-10-CM | POA: Diagnosis not present

## 2023-12-17 DIAGNOSIS — I480 Paroxysmal atrial fibrillation: Secondary | ICD-10-CM | POA: Diagnosis not present

## 2023-12-17 DIAGNOSIS — I499 Cardiac arrhythmia, unspecified: Secondary | ICD-10-CM | POA: Diagnosis not present

## 2023-12-17 DIAGNOSIS — Z79899 Other long term (current) drug therapy: Secondary | ICD-10-CM | POA: Diagnosis not present

## 2023-12-17 DIAGNOSIS — Z452 Encounter for adjustment and management of vascular access device: Secondary | ICD-10-CM | POA: Diagnosis not present

## 2023-12-17 DIAGNOSIS — E785 Hyperlipidemia, unspecified: Secondary | ICD-10-CM | POA: Diagnosis not present

## 2023-12-17 DIAGNOSIS — I6389 Other cerebral infarction: Secondary | ICD-10-CM | POA: Diagnosis not present

## 2023-12-17 DIAGNOSIS — I69314 Frontal lobe and executive function deficit following cerebral infarction: Secondary | ICD-10-CM | POA: Diagnosis not present

## 2023-12-17 DIAGNOSIS — R509 Fever, unspecified: Secondary | ICD-10-CM | POA: Diagnosis not present

## 2023-12-17 DIAGNOSIS — N3000 Acute cystitis without hematuria: Secondary | ICD-10-CM | POA: Diagnosis not present

## 2023-12-17 DIAGNOSIS — I10 Essential (primary) hypertension: Secondary | ICD-10-CM | POA: Diagnosis not present

## 2023-12-17 DIAGNOSIS — R918 Other nonspecific abnormal finding of lung field: Secondary | ICD-10-CM | POA: Diagnosis not present

## 2023-12-17 DIAGNOSIS — S82251A Displaced comminuted fracture of shaft of right tibia, initial encounter for closed fracture: Secondary | ICD-10-CM | POA: Diagnosis not present

## 2023-12-17 DIAGNOSIS — R0989 Other specified symptoms and signs involving the circulatory and respiratory systems: Secondary | ICD-10-CM | POA: Diagnosis not present

## 2023-12-17 DIAGNOSIS — I2510 Atherosclerotic heart disease of native coronary artery without angina pectoris: Secondary | ICD-10-CM | POA: Diagnosis not present

## 2023-12-18 DIAGNOSIS — I10 Essential (primary) hypertension: Secondary | ICD-10-CM | POA: Diagnosis not present

## 2023-12-18 DIAGNOSIS — I639 Cerebral infarction, unspecified: Secondary | ICD-10-CM | POA: Diagnosis not present

## 2023-12-18 DIAGNOSIS — R410 Disorientation, unspecified: Secondary | ICD-10-CM | POA: Diagnosis not present

## 2023-12-18 DIAGNOSIS — I499 Cardiac arrhythmia, unspecified: Secondary | ICD-10-CM | POA: Diagnosis not present

## 2023-12-18 DIAGNOSIS — Z79899 Other long term (current) drug therapy: Secondary | ICD-10-CM | POA: Diagnosis not present

## 2023-12-18 DIAGNOSIS — R509 Fever, unspecified: Secondary | ICD-10-CM | POA: Diagnosis not present

## 2023-12-18 DIAGNOSIS — I2510 Atherosclerotic heart disease of native coronary artery without angina pectoris: Secondary | ICD-10-CM | POA: Diagnosis not present

## 2023-12-18 DIAGNOSIS — E785 Hyperlipidemia, unspecified: Secondary | ICD-10-CM | POA: Diagnosis not present

## 2023-12-18 DIAGNOSIS — S82253A Displaced comminuted fracture of shaft of unspecified tibia, initial encounter for closed fracture: Secondary | ICD-10-CM | POA: Diagnosis not present

## 2023-12-18 DIAGNOSIS — Z792 Long term (current) use of antibiotics: Secondary | ICD-10-CM | POA: Diagnosis not present

## 2023-12-18 DIAGNOSIS — I63521 Cerebral infarction due to unspecified occlusion or stenosis of right anterior cerebral artery: Secondary | ICD-10-CM | POA: Diagnosis not present

## 2023-12-19 DIAGNOSIS — E785 Hyperlipidemia, unspecified: Secondary | ICD-10-CM | POA: Diagnosis not present

## 2023-12-19 DIAGNOSIS — X58XXXA Exposure to other specified factors, initial encounter: Secondary | ICD-10-CM | POA: Diagnosis not present

## 2023-12-19 DIAGNOSIS — I499 Cardiac arrhythmia, unspecified: Secondary | ICD-10-CM | POA: Diagnosis not present

## 2023-12-19 DIAGNOSIS — R410 Disorientation, unspecified: Secondary | ICD-10-CM | POA: Diagnosis not present

## 2023-12-19 DIAGNOSIS — Z79899 Other long term (current) drug therapy: Secondary | ICD-10-CM | POA: Diagnosis not present

## 2023-12-19 DIAGNOSIS — R509 Fever, unspecified: Secondary | ICD-10-CM | POA: Diagnosis not present

## 2023-12-19 DIAGNOSIS — Z7982 Long term (current) use of aspirin: Secondary | ICD-10-CM | POA: Diagnosis not present

## 2023-12-19 DIAGNOSIS — I2510 Atherosclerotic heart disease of native coronary artery without angina pectoris: Secondary | ICD-10-CM | POA: Diagnosis not present

## 2023-12-19 DIAGNOSIS — S82252A Displaced comminuted fracture of shaft of left tibia, initial encounter for closed fracture: Secondary | ICD-10-CM | POA: Diagnosis not present

## 2023-12-19 DIAGNOSIS — I10 Essential (primary) hypertension: Secondary | ICD-10-CM | POA: Diagnosis not present

## 2023-12-19 DIAGNOSIS — D62 Acute posthemorrhagic anemia: Secondary | ICD-10-CM | POA: Diagnosis not present

## 2023-12-19 DIAGNOSIS — I639 Cerebral infarction, unspecified: Secondary | ICD-10-CM | POA: Diagnosis not present

## 2023-12-20 DIAGNOSIS — D62 Acute posthemorrhagic anemia: Secondary | ICD-10-CM | POA: Diagnosis not present

## 2023-12-20 DIAGNOSIS — S82253A Displaced comminuted fracture of shaft of unspecified tibia, initial encounter for closed fracture: Secondary | ICD-10-CM | POA: Diagnosis not present

## 2023-12-20 DIAGNOSIS — Z7982 Long term (current) use of aspirin: Secondary | ICD-10-CM | POA: Diagnosis not present

## 2023-12-20 DIAGNOSIS — I69314 Frontal lobe and executive function deficit following cerebral infarction: Secondary | ICD-10-CM | POA: Diagnosis not present

## 2023-12-20 DIAGNOSIS — J690 Pneumonitis due to inhalation of food and vomit: Secondary | ICD-10-CM | POA: Diagnosis not present

## 2023-12-20 DIAGNOSIS — R410 Disorientation, unspecified: Secondary | ICD-10-CM | POA: Diagnosis not present

## 2023-12-20 DIAGNOSIS — I2510 Atherosclerotic heart disease of native coronary artery without angina pectoris: Secondary | ICD-10-CM | POA: Diagnosis not present

## 2023-12-20 DIAGNOSIS — Z79899 Other long term (current) drug therapy: Secondary | ICD-10-CM | POA: Diagnosis not present

## 2023-12-20 DIAGNOSIS — Z7901 Long term (current) use of anticoagulants: Secondary | ICD-10-CM | POA: Diagnosis not present

## 2023-12-21 DIAGNOSIS — I2510 Atherosclerotic heart disease of native coronary artery without angina pectoris: Secondary | ICD-10-CM | POA: Diagnosis not present

## 2023-12-21 DIAGNOSIS — D62 Acute posthemorrhagic anemia: Secondary | ICD-10-CM | POA: Diagnosis not present

## 2023-12-21 DIAGNOSIS — Z7982 Long term (current) use of aspirin: Secondary | ICD-10-CM | POA: Diagnosis not present

## 2023-12-21 DIAGNOSIS — R509 Fever, unspecified: Secondary | ICD-10-CM | POA: Diagnosis not present

## 2023-12-21 DIAGNOSIS — S82251A Displaced comminuted fracture of shaft of right tibia, initial encounter for closed fracture: Secondary | ICD-10-CM | POA: Diagnosis not present

## 2023-12-21 DIAGNOSIS — R410 Disorientation, unspecified: Secondary | ICD-10-CM | POA: Diagnosis not present

## 2023-12-21 DIAGNOSIS — Z79899 Other long term (current) drug therapy: Secondary | ICD-10-CM | POA: Diagnosis not present

## 2023-12-21 DIAGNOSIS — I4891 Unspecified atrial fibrillation: Secondary | ICD-10-CM | POA: Diagnosis not present

## 2023-12-21 DIAGNOSIS — I69314 Frontal lobe and executive function deficit following cerebral infarction: Secondary | ICD-10-CM | POA: Diagnosis not present

## 2023-12-22 DIAGNOSIS — Z79899 Other long term (current) drug therapy: Secondary | ICD-10-CM | POA: Diagnosis not present

## 2023-12-22 DIAGNOSIS — R918 Other nonspecific abnormal finding of lung field: Secondary | ICD-10-CM | POA: Diagnosis not present

## 2023-12-22 DIAGNOSIS — I2510 Atherosclerotic heart disease of native coronary artery without angina pectoris: Secondary | ICD-10-CM | POA: Diagnosis not present

## 2023-12-22 DIAGNOSIS — R410 Disorientation, unspecified: Secondary | ICD-10-CM | POA: Diagnosis not present

## 2023-12-22 DIAGNOSIS — Z452 Encounter for adjustment and management of vascular access device: Secondary | ICD-10-CM | POA: Diagnosis not present

## 2023-12-22 DIAGNOSIS — I639 Cerebral infarction, unspecified: Secondary | ICD-10-CM | POA: Diagnosis not present

## 2023-12-22 DIAGNOSIS — S82252A Displaced comminuted fracture of shaft of left tibia, initial encounter for closed fracture: Secondary | ICD-10-CM | POA: Diagnosis not present

## 2023-12-22 DIAGNOSIS — D649 Anemia, unspecified: Secondary | ICD-10-CM | POA: Diagnosis not present

## 2023-12-22 DIAGNOSIS — R0602 Shortness of breath: Secondary | ICD-10-CM | POA: Diagnosis not present

## 2023-12-22 DIAGNOSIS — Z7982 Long term (current) use of aspirin: Secondary | ICD-10-CM | POA: Diagnosis not present

## 2023-12-22 DIAGNOSIS — I10 Essential (primary) hypertension: Secondary | ICD-10-CM | POA: Diagnosis not present

## 2023-12-22 DIAGNOSIS — J9601 Acute respiratory failure with hypoxia: Secondary | ICD-10-CM | POA: Diagnosis not present

## 2023-12-22 DIAGNOSIS — E785 Hyperlipidemia, unspecified: Secondary | ICD-10-CM | POA: Diagnosis not present

## 2023-12-22 DIAGNOSIS — I499 Cardiac arrhythmia, unspecified: Secondary | ICD-10-CM | POA: Diagnosis not present

## 2023-12-23 DIAGNOSIS — S82252A Displaced comminuted fracture of shaft of left tibia, initial encounter for closed fracture: Secondary | ICD-10-CM | POA: Diagnosis not present

## 2023-12-23 DIAGNOSIS — I2510 Atherosclerotic heart disease of native coronary artery without angina pectoris: Secondary | ICD-10-CM | POA: Diagnosis not present

## 2023-12-23 DIAGNOSIS — I4891 Unspecified atrial fibrillation: Secondary | ICD-10-CM | POA: Diagnosis not present

## 2023-12-23 DIAGNOSIS — I639 Cerebral infarction, unspecified: Secondary | ICD-10-CM | POA: Diagnosis not present

## 2023-12-23 DIAGNOSIS — J9601 Acute respiratory failure with hypoxia: Secondary | ICD-10-CM | POA: Diagnosis not present

## 2023-12-23 DIAGNOSIS — J690 Pneumonitis due to inhalation of food and vomit: Secondary | ICD-10-CM | POA: Diagnosis not present

## 2023-12-23 DIAGNOSIS — R410 Disorientation, unspecified: Secondary | ICD-10-CM | POA: Diagnosis not present

## 2023-12-23 DIAGNOSIS — D62 Acute posthemorrhagic anemia: Secondary | ICD-10-CM | POA: Diagnosis not present

## 2023-12-23 DIAGNOSIS — Z792 Long term (current) use of antibiotics: Secondary | ICD-10-CM | POA: Diagnosis not present

## 2023-12-24 DIAGNOSIS — R1310 Dysphagia, unspecified: Secondary | ICD-10-CM | POA: Diagnosis not present

## 2023-12-24 DIAGNOSIS — D62 Acute posthemorrhagic anemia: Secondary | ICD-10-CM | POA: Diagnosis not present

## 2023-12-24 DIAGNOSIS — I2510 Atherosclerotic heart disease of native coronary artery without angina pectoris: Secondary | ICD-10-CM | POA: Diagnosis not present

## 2023-12-24 DIAGNOSIS — I4891 Unspecified atrial fibrillation: Secondary | ICD-10-CM | POA: Diagnosis not present

## 2023-12-24 DIAGNOSIS — S82253A Displaced comminuted fracture of shaft of unspecified tibia, initial encounter for closed fracture: Secondary | ICD-10-CM | POA: Diagnosis not present

## 2023-12-24 DIAGNOSIS — J9601 Acute respiratory failure with hypoxia: Secondary | ICD-10-CM | POA: Diagnosis not present

## 2023-12-24 DIAGNOSIS — R410 Disorientation, unspecified: Secondary | ICD-10-CM | POA: Diagnosis not present

## 2023-12-24 DIAGNOSIS — I63511 Cerebral infarction due to unspecified occlusion or stenosis of right middle cerebral artery: Secondary | ICD-10-CM | POA: Diagnosis not present

## 2023-12-24 DIAGNOSIS — J690 Pneumonitis due to inhalation of food and vomit: Secondary | ICD-10-CM | POA: Diagnosis not present

## 2023-12-25 DIAGNOSIS — R410 Disorientation, unspecified: Secondary | ICD-10-CM | POA: Diagnosis not present

## 2023-12-25 DIAGNOSIS — I639 Cerebral infarction, unspecified: Secondary | ICD-10-CM | POA: Diagnosis not present

## 2023-12-25 DIAGNOSIS — Z79899 Other long term (current) drug therapy: Secondary | ICD-10-CM | POA: Diagnosis not present

## 2023-12-25 DIAGNOSIS — R1310 Dysphagia, unspecified: Secondary | ICD-10-CM | POA: Diagnosis not present

## 2023-12-25 DIAGNOSIS — I2510 Atherosclerotic heart disease of native coronary artery without angina pectoris: Secondary | ICD-10-CM | POA: Diagnosis not present

## 2023-12-25 DIAGNOSIS — S82253A Displaced comminuted fracture of shaft of unspecified tibia, initial encounter for closed fracture: Secondary | ICD-10-CM | POA: Diagnosis not present

## 2023-12-25 DIAGNOSIS — J690 Pneumonitis due to inhalation of food and vomit: Secondary | ICD-10-CM | POA: Diagnosis not present

## 2023-12-25 DIAGNOSIS — J9601 Acute respiratory failure with hypoxia: Secondary | ICD-10-CM | POA: Diagnosis not present

## 2023-12-25 DIAGNOSIS — D62 Acute posthemorrhagic anemia: Secondary | ICD-10-CM | POA: Diagnosis not present

## 2023-12-26 DIAGNOSIS — I2510 Atherosclerotic heart disease of native coronary artery without angina pectoris: Secondary | ICD-10-CM | POA: Diagnosis not present

## 2023-12-26 DIAGNOSIS — Z79899 Other long term (current) drug therapy: Secondary | ICD-10-CM | POA: Diagnosis not present

## 2023-12-26 DIAGNOSIS — J984 Other disorders of lung: Secondary | ICD-10-CM | POA: Diagnosis not present

## 2023-12-26 DIAGNOSIS — R410 Disorientation, unspecified: Secondary | ICD-10-CM | POA: Diagnosis not present

## 2023-12-26 DIAGNOSIS — Z452 Encounter for adjustment and management of vascular access device: Secondary | ICD-10-CM | POA: Diagnosis not present

## 2023-12-26 DIAGNOSIS — I63531 Cerebral infarction due to unspecified occlusion or stenosis of right posterior cerebral artery: Secondary | ICD-10-CM | POA: Diagnosis not present

## 2023-12-26 DIAGNOSIS — R1310 Dysphagia, unspecified: Secondary | ICD-10-CM | POA: Diagnosis not present

## 2023-12-26 DIAGNOSIS — S82253A Displaced comminuted fracture of shaft of unspecified tibia, initial encounter for closed fracture: Secondary | ICD-10-CM | POA: Diagnosis not present

## 2023-12-26 DIAGNOSIS — J690 Pneumonitis due to inhalation of food and vomit: Secondary | ICD-10-CM | POA: Diagnosis not present

## 2023-12-26 DIAGNOSIS — R0902 Hypoxemia: Secondary | ICD-10-CM | POA: Diagnosis not present

## 2023-12-26 DIAGNOSIS — J9601 Acute respiratory failure with hypoxia: Secondary | ICD-10-CM | POA: Diagnosis not present

## 2023-12-26 DIAGNOSIS — D62 Acute posthemorrhagic anemia: Secondary | ICD-10-CM | POA: Diagnosis not present

## 2023-12-27 DIAGNOSIS — D62 Acute posthemorrhagic anemia: Secondary | ICD-10-CM | POA: Diagnosis not present

## 2023-12-27 DIAGNOSIS — Z7982 Long term (current) use of aspirin: Secondary | ICD-10-CM | POA: Diagnosis not present

## 2023-12-27 DIAGNOSIS — I69314 Frontal lobe and executive function deficit following cerebral infarction: Secondary | ICD-10-CM | POA: Diagnosis not present

## 2023-12-27 DIAGNOSIS — J690 Pneumonitis due to inhalation of food and vomit: Secondary | ICD-10-CM | POA: Diagnosis not present

## 2023-12-27 DIAGNOSIS — Z792 Long term (current) use of antibiotics: Secondary | ICD-10-CM | POA: Diagnosis not present

## 2023-12-27 DIAGNOSIS — S82253A Displaced comminuted fracture of shaft of unspecified tibia, initial encounter for closed fracture: Secondary | ICD-10-CM | POA: Diagnosis not present

## 2023-12-27 DIAGNOSIS — J9601 Acute respiratory failure with hypoxia: Secondary | ICD-10-CM | POA: Diagnosis not present

## 2023-12-27 DIAGNOSIS — I2510 Atherosclerotic heart disease of native coronary artery without angina pectoris: Secondary | ICD-10-CM | POA: Diagnosis not present

## 2023-12-27 DIAGNOSIS — R1310 Dysphagia, unspecified: Secondary | ICD-10-CM | POA: Diagnosis not present

## 2023-12-28 DIAGNOSIS — J690 Pneumonitis due to inhalation of food and vomit: Secondary | ICD-10-CM | POA: Diagnosis not present

## 2023-12-28 DIAGNOSIS — R1310 Dysphagia, unspecified: Secondary | ICD-10-CM | POA: Diagnosis not present

## 2023-12-28 DIAGNOSIS — J9601 Acute respiratory failure with hypoxia: Secondary | ICD-10-CM | POA: Diagnosis not present

## 2023-12-28 DIAGNOSIS — D62 Acute posthemorrhagic anemia: Secondary | ICD-10-CM | POA: Diagnosis not present

## 2023-12-28 DIAGNOSIS — I639 Cerebral infarction, unspecified: Secondary | ICD-10-CM | POA: Diagnosis not present

## 2023-12-28 DIAGNOSIS — I4891 Unspecified atrial fibrillation: Secondary | ICD-10-CM | POA: Diagnosis not present

## 2023-12-28 DIAGNOSIS — I2510 Atherosclerotic heart disease of native coronary artery without angina pectoris: Secondary | ICD-10-CM | POA: Diagnosis not present

## 2023-12-28 DIAGNOSIS — S82251A Displaced comminuted fracture of shaft of right tibia, initial encounter for closed fracture: Secondary | ICD-10-CM | POA: Diagnosis not present

## 2023-12-28 DIAGNOSIS — R410 Disorientation, unspecified: Secondary | ICD-10-CM | POA: Diagnosis not present

## 2023-12-29 DIAGNOSIS — I639 Cerebral infarction, unspecified: Secondary | ICD-10-CM | POA: Diagnosis not present

## 2023-12-29 DIAGNOSIS — J9601 Acute respiratory failure with hypoxia: Secondary | ICD-10-CM | POA: Diagnosis not present

## 2023-12-29 DIAGNOSIS — R1310 Dysphagia, unspecified: Secondary | ICD-10-CM | POA: Diagnosis not present

## 2023-12-29 DIAGNOSIS — J690 Pneumonitis due to inhalation of food and vomit: Secondary | ICD-10-CM | POA: Diagnosis not present

## 2023-12-29 DIAGNOSIS — I2510 Atherosclerotic heart disease of native coronary artery without angina pectoris: Secondary | ICD-10-CM | POA: Diagnosis not present

## 2023-12-29 DIAGNOSIS — I4891 Unspecified atrial fibrillation: Secondary | ICD-10-CM | POA: Diagnosis not present

## 2023-12-29 DIAGNOSIS — R410 Disorientation, unspecified: Secondary | ICD-10-CM | POA: Diagnosis not present

## 2023-12-29 DIAGNOSIS — D62 Acute posthemorrhagic anemia: Secondary | ICD-10-CM | POA: Diagnosis not present

## 2023-12-29 DIAGNOSIS — S82251A Displaced comminuted fracture of shaft of right tibia, initial encounter for closed fracture: Secondary | ICD-10-CM | POA: Diagnosis not present

## 2023-12-30 DIAGNOSIS — R1310 Dysphagia, unspecified: Secondary | ICD-10-CM | POA: Diagnosis not present

## 2023-12-30 DIAGNOSIS — Z7982 Long term (current) use of aspirin: Secondary | ICD-10-CM | POA: Diagnosis not present

## 2023-12-30 DIAGNOSIS — Z79899 Other long term (current) drug therapy: Secondary | ICD-10-CM | POA: Diagnosis not present

## 2023-12-30 DIAGNOSIS — I2510 Atherosclerotic heart disease of native coronary artery without angina pectoris: Secondary | ICD-10-CM | POA: Diagnosis not present

## 2023-12-30 DIAGNOSIS — I639 Cerebral infarction, unspecified: Secondary | ICD-10-CM | POA: Diagnosis not present

## 2023-12-30 DIAGNOSIS — S82253A Displaced comminuted fracture of shaft of unspecified tibia, initial encounter for closed fracture: Secondary | ICD-10-CM | POA: Diagnosis not present

## 2023-12-30 DIAGNOSIS — D62 Acute posthemorrhagic anemia: Secondary | ICD-10-CM | POA: Diagnosis not present

## 2023-12-30 DIAGNOSIS — J690 Pneumonitis due to inhalation of food and vomit: Secondary | ICD-10-CM | POA: Diagnosis not present

## 2024-01-01 DIAGNOSIS — I69154 Hemiplegia and hemiparesis following nontraumatic intracerebral hemorrhage affecting left non-dominant side: Secondary | ICD-10-CM | POA: Diagnosis not present

## 2024-01-01 DIAGNOSIS — S82292D Other fracture of shaft of left tibia, subsequent encounter for closed fracture with routine healing: Secondary | ICD-10-CM | POA: Diagnosis not present

## 2024-01-01 DIAGNOSIS — I2510 Atherosclerotic heart disease of native coronary artery without angina pectoris: Secondary | ICD-10-CM | POA: Diagnosis not present

## 2024-01-01 DIAGNOSIS — Z7401 Bed confinement status: Secondary | ICD-10-CM | POA: Diagnosis not present

## 2024-01-01 DIAGNOSIS — R1310 Dysphagia, unspecified: Secondary | ICD-10-CM | POA: Diagnosis not present

## 2024-01-01 DIAGNOSIS — I69192 Facial weakness following nontraumatic intracerebral hemorrhage: Secondary | ICD-10-CM | POA: Diagnosis not present

## 2024-01-01 DIAGNOSIS — R4701 Aphasia: Secondary | ICD-10-CM | POA: Diagnosis not present

## 2024-01-01 DIAGNOSIS — I69191 Dysphagia following nontraumatic intracerebral hemorrhage: Secondary | ICD-10-CM | POA: Diagnosis not present

## 2024-01-01 DIAGNOSIS — J9601 Acute respiratory failure with hypoxia: Secondary | ICD-10-CM | POA: Diagnosis not present

## 2024-01-01 DIAGNOSIS — I611 Nontraumatic intracerebral hemorrhage in hemisphere, cortical: Secondary | ICD-10-CM | POA: Diagnosis not present

## 2024-01-01 DIAGNOSIS — I639 Cerebral infarction, unspecified: Secondary | ICD-10-CM | POA: Diagnosis not present

## 2024-01-01 DIAGNOSIS — K219 Gastro-esophageal reflux disease without esophagitis: Secondary | ICD-10-CM | POA: Diagnosis not present

## 2024-01-01 DIAGNOSIS — D62 Acute posthemorrhagic anemia: Secondary | ICD-10-CM | POA: Diagnosis not present

## 2024-01-01 DIAGNOSIS — I69120 Aphasia following nontraumatic intracerebral hemorrhage: Secondary | ICD-10-CM | POA: Diagnosis not present

## 2024-01-01 DIAGNOSIS — R410 Disorientation, unspecified: Secondary | ICD-10-CM | POA: Diagnosis not present

## 2024-01-01 DIAGNOSIS — J690 Pneumonitis due to inhalation of food and vomit: Secondary | ICD-10-CM | POA: Diagnosis not present

## 2024-01-01 DIAGNOSIS — R531 Weakness: Secondary | ICD-10-CM | POA: Diagnosis not present

## 2024-01-01 DIAGNOSIS — S82253A Displaced comminuted fracture of shaft of unspecified tibia, initial encounter for closed fracture: Secondary | ICD-10-CM | POA: Diagnosis not present

## 2024-01-01 DIAGNOSIS — Z79899 Other long term (current) drug therapy: Secondary | ICD-10-CM | POA: Diagnosis not present

## 2024-01-01 DIAGNOSIS — I69314 Frontal lobe and executive function deficit following cerebral infarction: Secondary | ICD-10-CM | POA: Diagnosis not present

## 2024-01-02 NOTE — Telephone Encounter (Signed)
 01/02/24 Spoke with patients wife she states Seth Higgins is at [facility].  He has an order to get the staples out and everything he seems to be doing well.   States she will call us  to make a follow up appointment when he is discharged.  He does have a 14 day stay at this time with them.

## 2024-01-10 DIAGNOSIS — I639 Cerebral infarction, unspecified: Secondary | ICD-10-CM | POA: Diagnosis not present

## 2024-01-10 DIAGNOSIS — S82292D Other fracture of shaft of left tibia, subsequent encounter for closed fracture with routine healing: Secondary | ICD-10-CM | POA: Diagnosis not present

## 2024-01-10 DIAGNOSIS — I2510 Atherosclerotic heart disease of native coronary artery without angina pectoris: Secondary | ICD-10-CM | POA: Diagnosis not present

## 2024-01-10 DIAGNOSIS — I611 Nontraumatic intracerebral hemorrhage in hemisphere, cortical: Secondary | ICD-10-CM | POA: Diagnosis not present

## 2024-01-11 DIAGNOSIS — S82292D Other fracture of shaft of left tibia, subsequent encounter for closed fracture with routine healing: Secondary | ICD-10-CM | POA: Diagnosis not present

## 2024-01-11 DIAGNOSIS — I639 Cerebral infarction, unspecified: Secondary | ICD-10-CM | POA: Diagnosis not present

## 2024-01-11 DIAGNOSIS — I2510 Atherosclerotic heart disease of native coronary artery without angina pectoris: Secondary | ICD-10-CM | POA: Diagnosis not present

## 2024-01-11 DIAGNOSIS — I611 Nontraumatic intracerebral hemorrhage in hemisphere, cortical: Secondary | ICD-10-CM | POA: Diagnosis not present

## 2024-01-17 DIAGNOSIS — I639 Cerebral infarction, unspecified: Secondary | ICD-10-CM | POA: Diagnosis not present

## 2024-01-17 DIAGNOSIS — S82292D Other fracture of shaft of left tibia, subsequent encounter for closed fracture with routine healing: Secondary | ICD-10-CM | POA: Diagnosis not present

## 2024-01-17 DIAGNOSIS — I611 Nontraumatic intracerebral hemorrhage in hemisphere, cortical: Secondary | ICD-10-CM | POA: Diagnosis not present

## 2024-01-17 DIAGNOSIS — I2510 Atherosclerotic heart disease of native coronary artery without angina pectoris: Secondary | ICD-10-CM | POA: Diagnosis not present

## 2024-01-18 DIAGNOSIS — I639 Cerebral infarction, unspecified: Secondary | ICD-10-CM | POA: Diagnosis not present

## 2024-01-18 DIAGNOSIS — S82292D Other fracture of shaft of left tibia, subsequent encounter for closed fracture with routine healing: Secondary | ICD-10-CM | POA: Diagnosis not present

## 2024-01-18 DIAGNOSIS — I2510 Atherosclerotic heart disease of native coronary artery without angina pectoris: Secondary | ICD-10-CM | POA: Diagnosis not present

## 2024-01-18 DIAGNOSIS — I611 Nontraumatic intracerebral hemorrhage in hemisphere, cortical: Secondary | ICD-10-CM | POA: Diagnosis not present

## 2024-01-21 DIAGNOSIS — K5900 Constipation, unspecified: Secondary | ICD-10-CM | POA: Diagnosis not present

## 2024-01-23 DIAGNOSIS — I639 Cerebral infarction, unspecified: Secondary | ICD-10-CM | POA: Diagnosis not present

## 2024-01-26 DIAGNOSIS — I471 Supraventricular tachycardia, unspecified: Secondary | ICD-10-CM | POA: Diagnosis not present

## 2024-01-26 DIAGNOSIS — I440 Atrioventricular block, first degree: Secondary | ICD-10-CM | POA: Diagnosis not present

## 2024-01-26 DIAGNOSIS — I4729 Other ventricular tachycardia: Secondary | ICD-10-CM | POA: Diagnosis not present

## 2024-01-27 DIAGNOSIS — I2510 Atherosclerotic heart disease of native coronary artery without angina pectoris: Secondary | ICD-10-CM | POA: Diagnosis not present

## 2024-01-27 DIAGNOSIS — R1310 Dysphagia, unspecified: Secondary | ICD-10-CM | POA: Diagnosis not present

## 2024-01-27 DIAGNOSIS — I69392 Facial weakness following cerebral infarction: Secondary | ICD-10-CM | POA: Diagnosis not present

## 2024-01-27 DIAGNOSIS — J690 Pneumonitis due to inhalation of food and vomit: Secondary | ICD-10-CM | POA: Diagnosis not present

## 2024-01-27 DIAGNOSIS — I252 Old myocardial infarction: Secondary | ICD-10-CM | POA: Diagnosis not present

## 2024-01-27 DIAGNOSIS — I69320 Aphasia following cerebral infarction: Secondary | ICD-10-CM | POA: Diagnosis not present

## 2024-01-27 DIAGNOSIS — R29898 Other symptoms and signs involving the musculoskeletal system: Secondary | ICD-10-CM | POA: Diagnosis not present

## 2024-01-27 DIAGNOSIS — S82252D Displaced comminuted fracture of shaft of left tibia, subsequent encounter for closed fracture with routine healing: Secondary | ICD-10-CM | POA: Diagnosis not present

## 2024-01-27 DIAGNOSIS — I69398 Other sequelae of cerebral infarction: Secondary | ICD-10-CM | POA: Diagnosis not present

## 2024-02-02 DIAGNOSIS — S82252D Displaced comminuted fracture of shaft of left tibia, subsequent encounter for closed fracture with routine healing: Secondary | ICD-10-CM | POA: Diagnosis not present

## 2024-02-02 DIAGNOSIS — I69392 Facial weakness following cerebral infarction: Secondary | ICD-10-CM | POA: Diagnosis not present

## 2024-02-02 DIAGNOSIS — I69398 Other sequelae of cerebral infarction: Secondary | ICD-10-CM | POA: Diagnosis not present

## 2024-02-02 DIAGNOSIS — R1310 Dysphagia, unspecified: Secondary | ICD-10-CM | POA: Diagnosis not present

## 2024-02-02 DIAGNOSIS — J690 Pneumonitis due to inhalation of food and vomit: Secondary | ICD-10-CM | POA: Diagnosis not present

## 2024-02-02 DIAGNOSIS — I69320 Aphasia following cerebral infarction: Secondary | ICD-10-CM | POA: Diagnosis not present

## 2024-02-02 DIAGNOSIS — I2510 Atherosclerotic heart disease of native coronary artery without angina pectoris: Secondary | ICD-10-CM | POA: Diagnosis not present

## 2024-02-02 DIAGNOSIS — R29898 Other symptoms and signs involving the musculoskeletal system: Secondary | ICD-10-CM | POA: Diagnosis not present

## 2024-02-04 DIAGNOSIS — I69392 Facial weakness following cerebral infarction: Secondary | ICD-10-CM | POA: Diagnosis not present

## 2024-02-04 DIAGNOSIS — S82252D Displaced comminuted fracture of shaft of left tibia, subsequent encounter for closed fracture with routine healing: Secondary | ICD-10-CM | POA: Diagnosis not present

## 2024-02-04 DIAGNOSIS — J690 Pneumonitis due to inhalation of food and vomit: Secondary | ICD-10-CM | POA: Diagnosis not present

## 2024-02-04 DIAGNOSIS — I252 Old myocardial infarction: Secondary | ICD-10-CM | POA: Diagnosis not present

## 2024-02-04 DIAGNOSIS — I69398 Other sequelae of cerebral infarction: Secondary | ICD-10-CM | POA: Diagnosis not present

## 2024-02-04 DIAGNOSIS — R1310 Dysphagia, unspecified: Secondary | ICD-10-CM | POA: Diagnosis not present

## 2024-02-04 DIAGNOSIS — I2510 Atherosclerotic heart disease of native coronary artery without angina pectoris: Secondary | ICD-10-CM | POA: Diagnosis not present

## 2024-02-04 DIAGNOSIS — R29898 Other symptoms and signs involving the musculoskeletal system: Secondary | ICD-10-CM | POA: Diagnosis not present

## 2024-02-04 DIAGNOSIS — I69320 Aphasia following cerebral infarction: Secondary | ICD-10-CM | POA: Diagnosis not present

## 2024-02-05 DIAGNOSIS — Z23 Encounter for immunization: Secondary | ICD-10-CM | POA: Diagnosis not present

## 2024-02-05 DIAGNOSIS — G8194 Hemiplegia, unspecified affecting left nondominant side: Secondary | ICD-10-CM | POA: Diagnosis not present

## 2024-02-05 DIAGNOSIS — I2510 Atherosclerotic heart disease of native coronary artery without angina pectoris: Secondary | ICD-10-CM | POA: Diagnosis not present

## 2024-02-05 DIAGNOSIS — B0229 Other postherpetic nervous system involvement: Secondary | ICD-10-CM | POA: Diagnosis not present

## 2024-02-05 DIAGNOSIS — Z6820 Body mass index (BMI) 20.0-20.9, adult: Secondary | ICD-10-CM | POA: Diagnosis not present

## 2024-02-05 DIAGNOSIS — E782 Mixed hyperlipidemia: Secondary | ICD-10-CM | POA: Diagnosis not present

## 2024-02-06 DIAGNOSIS — J690 Pneumonitis due to inhalation of food and vomit: Secondary | ICD-10-CM | POA: Diagnosis not present

## 2024-02-06 DIAGNOSIS — I2510 Atherosclerotic heart disease of native coronary artery without angina pectoris: Secondary | ICD-10-CM | POA: Diagnosis not present

## 2024-02-06 DIAGNOSIS — I69398 Other sequelae of cerebral infarction: Secondary | ICD-10-CM | POA: Diagnosis not present

## 2024-02-06 DIAGNOSIS — R1310 Dysphagia, unspecified: Secondary | ICD-10-CM | POA: Diagnosis not present

## 2024-02-06 DIAGNOSIS — R29898 Other symptoms and signs involving the musculoskeletal system: Secondary | ICD-10-CM | POA: Diagnosis not present

## 2024-02-06 DIAGNOSIS — S82252D Displaced comminuted fracture of shaft of left tibia, subsequent encounter for closed fracture with routine healing: Secondary | ICD-10-CM | POA: Diagnosis not present

## 2024-02-06 DIAGNOSIS — I252 Old myocardial infarction: Secondary | ICD-10-CM | POA: Diagnosis not present

## 2024-02-06 DIAGNOSIS — I69320 Aphasia following cerebral infarction: Secondary | ICD-10-CM | POA: Diagnosis not present

## 2024-02-06 DIAGNOSIS — I69392 Facial weakness following cerebral infarction: Secondary | ICD-10-CM | POA: Diagnosis not present

## 2024-02-09 DIAGNOSIS — I69398 Other sequelae of cerebral infarction: Secondary | ICD-10-CM | POA: Diagnosis not present

## 2024-02-09 DIAGNOSIS — I2510 Atherosclerotic heart disease of native coronary artery without angina pectoris: Secondary | ICD-10-CM | POA: Diagnosis not present

## 2024-02-09 DIAGNOSIS — R29898 Other symptoms and signs involving the musculoskeletal system: Secondary | ICD-10-CM | POA: Diagnosis not present

## 2024-02-09 DIAGNOSIS — J690 Pneumonitis due to inhalation of food and vomit: Secondary | ICD-10-CM | POA: Diagnosis not present

## 2024-02-09 DIAGNOSIS — I69320 Aphasia following cerebral infarction: Secondary | ICD-10-CM | POA: Diagnosis not present

## 2024-02-09 DIAGNOSIS — I252 Old myocardial infarction: Secondary | ICD-10-CM | POA: Diagnosis not present

## 2024-02-09 DIAGNOSIS — I69392 Facial weakness following cerebral infarction: Secondary | ICD-10-CM | POA: Diagnosis not present

## 2024-02-09 DIAGNOSIS — S82252D Displaced comminuted fracture of shaft of left tibia, subsequent encounter for closed fracture with routine healing: Secondary | ICD-10-CM | POA: Diagnosis not present

## 2024-02-09 DIAGNOSIS — R1310 Dysphagia, unspecified: Secondary | ICD-10-CM | POA: Diagnosis not present

## 2024-02-12 DIAGNOSIS — S82252D Displaced comminuted fracture of shaft of left tibia, subsequent encounter for closed fracture with routine healing: Secondary | ICD-10-CM | POA: Diagnosis not present

## 2024-02-12 DIAGNOSIS — I69392 Facial weakness following cerebral infarction: Secondary | ICD-10-CM | POA: Diagnosis not present

## 2024-02-12 DIAGNOSIS — R1310 Dysphagia, unspecified: Secondary | ICD-10-CM | POA: Diagnosis not present

## 2024-02-12 DIAGNOSIS — I2510 Atherosclerotic heart disease of native coronary artery without angina pectoris: Secondary | ICD-10-CM | POA: Diagnosis not present

## 2024-02-12 DIAGNOSIS — R29898 Other symptoms and signs involving the musculoskeletal system: Secondary | ICD-10-CM | POA: Diagnosis not present

## 2024-02-12 DIAGNOSIS — I69398 Other sequelae of cerebral infarction: Secondary | ICD-10-CM | POA: Diagnosis not present

## 2024-02-12 DIAGNOSIS — J690 Pneumonitis due to inhalation of food and vomit: Secondary | ICD-10-CM | POA: Diagnosis not present

## 2024-02-12 DIAGNOSIS — I252 Old myocardial infarction: Secondary | ICD-10-CM | POA: Diagnosis not present

## 2024-02-16 DIAGNOSIS — I69390 Apraxia following cerebral infarction: Secondary | ICD-10-CM | POA: Diagnosis not present

## 2024-02-16 DIAGNOSIS — I252 Old myocardial infarction: Secondary | ICD-10-CM | POA: Diagnosis not present

## 2024-02-16 DIAGNOSIS — I69392 Facial weakness following cerebral infarction: Secondary | ICD-10-CM | POA: Diagnosis not present

## 2024-02-16 DIAGNOSIS — J690 Pneumonitis due to inhalation of food and vomit: Secondary | ICD-10-CM | POA: Diagnosis not present

## 2024-02-16 DIAGNOSIS — I69398 Other sequelae of cerebral infarction: Secondary | ICD-10-CM | POA: Diagnosis not present

## 2024-02-16 DIAGNOSIS — R1310 Dysphagia, unspecified: Secondary | ICD-10-CM | POA: Diagnosis not present

## 2024-02-16 DIAGNOSIS — I69320 Aphasia following cerebral infarction: Secondary | ICD-10-CM | POA: Diagnosis not present

## 2024-02-16 DIAGNOSIS — I2510 Atherosclerotic heart disease of native coronary artery without angina pectoris: Secondary | ICD-10-CM | POA: Diagnosis not present

## 2024-02-16 DIAGNOSIS — S82252D Displaced comminuted fracture of shaft of left tibia, subsequent encounter for closed fracture with routine healing: Secondary | ICD-10-CM | POA: Diagnosis not present

## 2024-02-16 DIAGNOSIS — R29898 Other symptoms and signs involving the musculoskeletal system: Secondary | ICD-10-CM | POA: Diagnosis not present

## 2024-02-18 DIAGNOSIS — I25118 Atherosclerotic heart disease of native coronary artery with other forms of angina pectoris: Secondary | ICD-10-CM | POA: Diagnosis not present

## 2024-02-18 DIAGNOSIS — I63521 Cerebral infarction due to unspecified occlusion or stenosis of right anterior cerebral artery: Secondary | ICD-10-CM | POA: Diagnosis not present

## 2024-02-18 DIAGNOSIS — I10 Essential (primary) hypertension: Secondary | ICD-10-CM | POA: Diagnosis not present

## 2024-02-18 DIAGNOSIS — E782 Mixed hyperlipidemia: Secondary | ICD-10-CM | POA: Diagnosis not present

## 2024-02-19 DIAGNOSIS — I252 Old myocardial infarction: Secondary | ICD-10-CM | POA: Diagnosis not present

## 2024-02-19 DIAGNOSIS — I69320 Aphasia following cerebral infarction: Secondary | ICD-10-CM | POA: Diagnosis not present

## 2024-02-19 DIAGNOSIS — I69392 Facial weakness following cerebral infarction: Secondary | ICD-10-CM | POA: Diagnosis not present

## 2024-02-19 DIAGNOSIS — J690 Pneumonitis due to inhalation of food and vomit: Secondary | ICD-10-CM | POA: Diagnosis not present

## 2024-02-19 DIAGNOSIS — S82252D Displaced comminuted fracture of shaft of left tibia, subsequent encounter for closed fracture with routine healing: Secondary | ICD-10-CM | POA: Diagnosis not present

## 2024-02-19 DIAGNOSIS — R1310 Dysphagia, unspecified: Secondary | ICD-10-CM | POA: Diagnosis not present

## 2024-02-19 DIAGNOSIS — I69398 Other sequelae of cerebral infarction: Secondary | ICD-10-CM | POA: Diagnosis not present

## 2024-02-19 DIAGNOSIS — R29898 Other symptoms and signs involving the musculoskeletal system: Secondary | ICD-10-CM | POA: Diagnosis not present

## 2024-02-19 DIAGNOSIS — I2510 Atherosclerotic heart disease of native coronary artery without angina pectoris: Secondary | ICD-10-CM | POA: Diagnosis not present

## 2024-02-20 DIAGNOSIS — M79605 Pain in left leg: Secondary | ICD-10-CM | POA: Diagnosis not present

## 2024-02-21 DIAGNOSIS — Z23 Encounter for immunization: Secondary | ICD-10-CM | POA: Diagnosis not present

## 2024-02-21 DIAGNOSIS — G8194 Hemiplegia, unspecified affecting left nondominant side: Secondary | ICD-10-CM | POA: Diagnosis not present

## 2024-02-21 DIAGNOSIS — I2510 Atherosclerotic heart disease of native coronary artery without angina pectoris: Secondary | ICD-10-CM | POA: Diagnosis not present

## 2024-02-21 DIAGNOSIS — B0229 Other postherpetic nervous system involvement: Secondary | ICD-10-CM | POA: Diagnosis not present

## 2024-02-26 DIAGNOSIS — I69392 Facial weakness following cerebral infarction: Secondary | ICD-10-CM | POA: Diagnosis not present

## 2024-02-26 DIAGNOSIS — R1310 Dysphagia, unspecified: Secondary | ICD-10-CM | POA: Diagnosis not present

## 2024-02-26 DIAGNOSIS — I252 Old myocardial infarction: Secondary | ICD-10-CM | POA: Diagnosis not present

## 2024-02-26 DIAGNOSIS — R29898 Other symptoms and signs involving the musculoskeletal system: Secondary | ICD-10-CM | POA: Diagnosis not present

## 2024-02-26 DIAGNOSIS — I69398 Other sequelae of cerebral infarction: Secondary | ICD-10-CM | POA: Diagnosis not present

## 2024-02-26 DIAGNOSIS — I2510 Atherosclerotic heart disease of native coronary artery without angina pectoris: Secondary | ICD-10-CM | POA: Diagnosis not present

## 2024-02-26 DIAGNOSIS — S82252D Displaced comminuted fracture of shaft of left tibia, subsequent encounter for closed fracture with routine healing: Secondary | ICD-10-CM | POA: Diagnosis not present

## 2024-02-26 DIAGNOSIS — I69320 Aphasia following cerebral infarction: Secondary | ICD-10-CM | POA: Diagnosis not present

## 2024-02-26 DIAGNOSIS — J690 Pneumonitis due to inhalation of food and vomit: Secondary | ICD-10-CM | POA: Diagnosis not present

## 2024-02-27 DIAGNOSIS — I2510 Atherosclerotic heart disease of native coronary artery without angina pectoris: Secondary | ICD-10-CM | POA: Diagnosis not present

## 2024-02-27 DIAGNOSIS — I69398 Other sequelae of cerebral infarction: Secondary | ICD-10-CM | POA: Diagnosis not present

## 2024-02-27 DIAGNOSIS — I69320 Aphasia following cerebral infarction: Secondary | ICD-10-CM | POA: Diagnosis not present

## 2024-02-27 DIAGNOSIS — S82252D Displaced comminuted fracture of shaft of left tibia, subsequent encounter for closed fracture with routine healing: Secondary | ICD-10-CM | POA: Diagnosis not present

## 2024-03-01 DIAGNOSIS — S82252D Displaced comminuted fracture of shaft of left tibia, subsequent encounter for closed fracture with routine healing: Secondary | ICD-10-CM | POA: Diagnosis not present

## 2024-03-04 DIAGNOSIS — I69320 Aphasia following cerebral infarction: Secondary | ICD-10-CM | POA: Diagnosis not present

## 2024-03-05 DIAGNOSIS — R739 Hyperglycemia, unspecified: Secondary | ICD-10-CM | POA: Diagnosis not present

## 2024-03-05 DIAGNOSIS — E7849 Other hyperlipidemia: Secondary | ICD-10-CM | POA: Diagnosis not present

## 2024-03-05 DIAGNOSIS — Z0001 Encounter for general adult medical examination with abnormal findings: Secondary | ICD-10-CM | POA: Diagnosis not present

## 2024-03-08 DIAGNOSIS — J690 Pneumonitis due to inhalation of food and vomit: Secondary | ICD-10-CM | POA: Diagnosis not present

## 2024-03-08 DIAGNOSIS — I252 Old myocardial infarction: Secondary | ICD-10-CM | POA: Diagnosis not present

## 2024-03-08 DIAGNOSIS — R1310 Dysphagia, unspecified: Secondary | ICD-10-CM | POA: Diagnosis not present

## 2024-03-08 DIAGNOSIS — I69398 Other sequelae of cerebral infarction: Secondary | ICD-10-CM | POA: Diagnosis not present

## 2024-03-08 DIAGNOSIS — I2510 Atherosclerotic heart disease of native coronary artery without angina pectoris: Secondary | ICD-10-CM | POA: Diagnosis not present

## 2024-03-08 DIAGNOSIS — I69320 Aphasia following cerebral infarction: Secondary | ICD-10-CM | POA: Diagnosis not present

## 2024-03-08 DIAGNOSIS — R29898 Other symptoms and signs involving the musculoskeletal system: Secondary | ICD-10-CM | POA: Diagnosis not present

## 2024-03-08 DIAGNOSIS — S82252D Displaced comminuted fracture of shaft of left tibia, subsequent encounter for closed fracture with routine healing: Secondary | ICD-10-CM | POA: Diagnosis not present

## 2024-03-08 DIAGNOSIS — I69392 Facial weakness following cerebral infarction: Secondary | ICD-10-CM | POA: Diagnosis not present

## 2024-03-09 DIAGNOSIS — G8194 Hemiplegia, unspecified affecting left nondominant side: Secondary | ICD-10-CM | POA: Diagnosis not present

## 2024-03-09 DIAGNOSIS — I2510 Atherosclerotic heart disease of native coronary artery without angina pectoris: Secondary | ICD-10-CM | POA: Diagnosis not present

## 2024-03-09 DIAGNOSIS — F331 Major depressive disorder, recurrent, moderate: Secondary | ICD-10-CM | POA: Diagnosis not present

## 2024-03-09 DIAGNOSIS — Z0001 Encounter for general adult medical examination with abnormal findings: Secondary | ICD-10-CM | POA: Diagnosis not present

## 2024-03-09 DIAGNOSIS — Z2911 Encounter for prophylactic immunotherapy for respiratory syncytial virus (RSV): Secondary | ICD-10-CM | POA: Diagnosis not present

## 2024-03-09 DIAGNOSIS — Z1331 Encounter for screening for depression: Secondary | ICD-10-CM | POA: Diagnosis not present

## 2024-03-09 DIAGNOSIS — E1159 Type 2 diabetes mellitus with other circulatory complications: Secondary | ICD-10-CM | POA: Diagnosis not present

## 2024-03-09 DIAGNOSIS — K2100 Gastro-esophageal reflux disease with esophagitis, without bleeding: Secondary | ICD-10-CM | POA: Diagnosis not present

## 2024-03-09 DIAGNOSIS — B0229 Other postherpetic nervous system involvement: Secondary | ICD-10-CM | POA: Diagnosis not present

## 2024-03-10 DIAGNOSIS — I69398 Other sequelae of cerebral infarction: Secondary | ICD-10-CM | POA: Diagnosis not present

## 2024-03-10 DIAGNOSIS — S82252D Displaced comminuted fracture of shaft of left tibia, subsequent encounter for closed fracture with routine healing: Secondary | ICD-10-CM | POA: Diagnosis not present

## 2024-03-10 DIAGNOSIS — J690 Pneumonitis due to inhalation of food and vomit: Secondary | ICD-10-CM | POA: Diagnosis not present

## 2024-03-10 DIAGNOSIS — I2510 Atherosclerotic heart disease of native coronary artery without angina pectoris: Secondary | ICD-10-CM | POA: Diagnosis not present

## 2024-03-10 DIAGNOSIS — I252 Old myocardial infarction: Secondary | ICD-10-CM | POA: Diagnosis not present

## 2024-03-10 DIAGNOSIS — R29898 Other symptoms and signs involving the musculoskeletal system: Secondary | ICD-10-CM | POA: Diagnosis not present

## 2024-03-10 DIAGNOSIS — I69320 Aphasia following cerebral infarction: Secondary | ICD-10-CM | POA: Diagnosis not present

## 2024-03-10 DIAGNOSIS — I69392 Facial weakness following cerebral infarction: Secondary | ICD-10-CM | POA: Diagnosis not present

## 2024-03-18 ENCOUNTER — Encounter (HOSPITAL_COMMUNITY): Payer: Self-pay | Admitting: Occupational Therapy

## 2024-03-18 ENCOUNTER — Ambulatory Visit (HOSPITAL_COMMUNITY): Attending: Family Medicine | Admitting: Occupational Therapy

## 2024-03-18 DIAGNOSIS — R29818 Other symptoms and signs involving the nervous system: Secondary | ICD-10-CM | POA: Diagnosis not present

## 2024-03-18 DIAGNOSIS — R278 Other lack of coordination: Secondary | ICD-10-CM | POA: Insufficient documentation
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
## 2024-03-23 ENCOUNTER — Other Ambulatory Visit: Payer: Self-pay

## 2024-03-23 ENCOUNTER — Ambulatory Visit (HOSPITAL_COMMUNITY): Attending: Family Medicine | Admitting: Speech Pathology

## 2024-03-23 ENCOUNTER — Encounter (HOSPITAL_COMMUNITY): Payer: Self-pay | Admitting: Speech Pathology

## 2024-03-23 DIAGNOSIS — R29818 Other symptoms and signs involving the nervous system: Secondary | ICD-10-CM | POA: Diagnosis not present

## 2024-03-23 DIAGNOSIS — R2689 Other abnormalities of gait and mobility: Secondary | ICD-10-CM | POA: Insufficient documentation

## 2024-03-23 DIAGNOSIS — Z7409 Other reduced mobility: Secondary | ICD-10-CM | POA: Insufficient documentation

## 2024-03-23 DIAGNOSIS — M6281 Muscle weakness (generalized): Secondary | ICD-10-CM | POA: Insufficient documentation

## 2024-03-23 DIAGNOSIS — R41841 Cognitive communication deficit: Secondary | ICD-10-CM | POA: Diagnosis not present

## 2024-03-23 DIAGNOSIS — R471 Dysarthria and anarthria: Secondary | ICD-10-CM | POA: Diagnosis not present

## 2024-03-23 DIAGNOSIS — R278 Other lack of coordination: Secondary | ICD-10-CM | POA: Diagnosis not present
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
## 2024-03-25 ENCOUNTER — Ambulatory Visit (HOSPITAL_COMMUNITY)

## 2024-03-25 ENCOUNTER — Encounter (INDEPENDENT_AMBULATORY_CARE_PROVIDER_SITE_OTHER): Payer: Self-pay | Admitting: Gastroenterology

## 2024-03-25 DIAGNOSIS — Z7409 Other reduced mobility: Secondary | ICD-10-CM

## 2024-03-25 DIAGNOSIS — R278 Other lack of coordination: Secondary | ICD-10-CM | POA: Diagnosis not present

## 2024-03-25 DIAGNOSIS — R29818 Other symptoms and signs involving the nervous system: Secondary | ICD-10-CM | POA: Diagnosis not present

## 2024-03-25 DIAGNOSIS — R471 Dysarthria and anarthria: Secondary | ICD-10-CM | POA: Diagnosis not present

## 2024-03-25 DIAGNOSIS — M6281 Muscle weakness (generalized): Secondary | ICD-10-CM | POA: Diagnosis not present

## 2024-03-25 DIAGNOSIS — R41841 Cognitive communication deficit: Secondary | ICD-10-CM | POA: Diagnosis not present

## 2024-03-25 DIAGNOSIS — R2689 Other abnormalities of gait and mobility: Secondary | ICD-10-CM
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
## 2024-03-30 ENCOUNTER — Ambulatory Visit (HOSPITAL_COMMUNITY)

## 2024-03-30 ENCOUNTER — Encounter (HOSPITAL_COMMUNITY): Payer: Self-pay

## 2024-03-30 DIAGNOSIS — R471 Dysarthria and anarthria: Secondary | ICD-10-CM | POA: Diagnosis not present

## 2024-03-30 DIAGNOSIS — R41841 Cognitive communication deficit: Secondary | ICD-10-CM | POA: Diagnosis not present

## 2024-03-30 DIAGNOSIS — R2689 Other abnormalities of gait and mobility: Secondary | ICD-10-CM

## 2024-03-30 DIAGNOSIS — R29818 Other symptoms and signs involving the nervous system: Secondary | ICD-10-CM | POA: Diagnosis not present

## 2024-03-30 DIAGNOSIS — Z7409 Other reduced mobility: Secondary | ICD-10-CM

## 2024-03-30 DIAGNOSIS — M6281 Muscle weakness (generalized): Secondary | ICD-10-CM | POA: Diagnosis not present

## 2024-03-30 DIAGNOSIS — R278 Other lack of coordination: Secondary | ICD-10-CM | POA: Diagnosis not present
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
## 2024-04-02 DIAGNOSIS — S82252E Displaced comminuted fracture of shaft of left tibia, subsequent encounter for open fracture type I or II with routine healing: Secondary | ICD-10-CM | POA: Diagnosis not present

## 2024-04-02 DIAGNOSIS — M79605 Pain in left leg: Secondary | ICD-10-CM | POA: Diagnosis not present

## 2024-04-05 ENCOUNTER — Ambulatory Visit (HOSPITAL_COMMUNITY)

## 2024-04-05 DIAGNOSIS — M6281 Muscle weakness (generalized): Secondary | ICD-10-CM

## 2024-04-05 DIAGNOSIS — R2689 Other abnormalities of gait and mobility: Secondary | ICD-10-CM

## 2024-04-05 DIAGNOSIS — R278 Other lack of coordination: Secondary | ICD-10-CM | POA: Diagnosis not present

## 2024-04-05 DIAGNOSIS — Z7409 Other reduced mobility: Secondary | ICD-10-CM

## 2024-04-05 DIAGNOSIS — R29818 Other symptoms and signs involving the nervous system: Secondary | ICD-10-CM | POA: Diagnosis not present

## 2024-04-05 DIAGNOSIS — R41841 Cognitive communication deficit: Secondary | ICD-10-CM | POA: Diagnosis not present

## 2024-04-05 DIAGNOSIS — R471 Dysarthria and anarthria: Secondary | ICD-10-CM | POA: Diagnosis not present
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
## 2024-04-07 ENCOUNTER — Encounter (HOSPITAL_COMMUNITY): Payer: Self-pay | Admitting: Occupational Therapy

## 2024-04-07 ENCOUNTER — Ambulatory Visit (HOSPITAL_COMMUNITY): Admitting: Occupational Therapy

## 2024-04-07 DIAGNOSIS — R278 Other lack of coordination: Secondary | ICD-10-CM

## 2024-04-07 DIAGNOSIS — R471 Dysarthria and anarthria: Secondary | ICD-10-CM | POA: Diagnosis not present

## 2024-04-07 DIAGNOSIS — M6281 Muscle weakness (generalized): Secondary | ICD-10-CM | POA: Diagnosis not present

## 2024-04-07 DIAGNOSIS — R2689 Other abnormalities of gait and mobility: Secondary | ICD-10-CM | POA: Diagnosis not present

## 2024-04-07 DIAGNOSIS — R41841 Cognitive communication deficit: Secondary | ICD-10-CM | POA: Diagnosis not present

## 2024-04-07 DIAGNOSIS — R29818 Other symptoms and signs involving the nervous system: Secondary | ICD-10-CM | POA: Diagnosis not present

## 2024-04-07 DIAGNOSIS — Z7409 Other reduced mobility: Secondary | ICD-10-CM | POA: Diagnosis not present
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
## 2024-04-07 NOTE — Patient Instructions (Signed)
 Perform each exercise ____10-15____ reps. 2-3x days.   1) Protraction   Start by holding a wand or cane at chest height.  Next, slowly push the wand outwards in front of your body so that your elbows become fully straightened. Then, return to the original position.     2) Shoulder FLEXION   In the standing position, hold a wand/cane with both arms, palms down on both sides. Raise up the wand/cane allowing your unaffected arm to perform most of the effort. Your affected arm should be partially relaxed.         5) Horizontal Abduction/Adduction      Straight arms holding cane at shoulder height, bring cane to right, center, left. Repeat starting to left.   Copyright  VHI. All rights reserved.      Weight-bearing for Shoulder Subluxation   1) Weight-bearing lean stretch: From a seated position on your bed or bench, prop yourself up on your affected arm by placing your affected arm about a foot away from your body. Then lean into it.  Hold for 10 seconds.    OR   2) Rocking Side-to-Side: place both arms out beside you (one on either side). Then slowly rock from side to side, shifting your weight from one arm to the next. You can place a rolled up towel underneath your hand to increase comfort.   3) Weight-bearing in standing:  Gently lean your body weight into your hand or fist. Keep the elbow as straight as possible. Hold for 10 seconds.

## 2024-04-09 ENCOUNTER — Encounter (HOSPITAL_COMMUNITY): Payer: Self-pay | Admitting: Occupational Therapy

## 2024-04-09 ENCOUNTER — Ambulatory Visit (HOSPITAL_COMMUNITY): Admitting: Occupational Therapy

## 2024-04-09 DIAGNOSIS — M6281 Muscle weakness (generalized): Secondary | ICD-10-CM | POA: Diagnosis not present

## 2024-04-09 DIAGNOSIS — R278 Other lack of coordination: Secondary | ICD-10-CM | POA: Diagnosis not present

## 2024-04-09 DIAGNOSIS — R29818 Other symptoms and signs involving the nervous system: Secondary | ICD-10-CM

## 2024-04-09 DIAGNOSIS — R41841 Cognitive communication deficit: Secondary | ICD-10-CM | POA: Diagnosis not present

## 2024-04-09 DIAGNOSIS — R2689 Other abnormalities of gait and mobility: Secondary | ICD-10-CM | POA: Diagnosis not present

## 2024-04-09 DIAGNOSIS — Z7409 Other reduced mobility: Secondary | ICD-10-CM | POA: Diagnosis not present

## 2024-04-09 DIAGNOSIS — R471 Dysarthria and anarthria: Secondary | ICD-10-CM | POA: Diagnosis not present
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
## 2024-04-09 NOTE — Patient Instructions (Addendum)
 AROM Exercises   1) Wrist Flexion  Start with wrist at edge of table, palm facing up. With wrist hanging slightly off table, curl wrist upward, and back down.      2) Wrist Extension  Start with wrist at edge of table, palm facing down. With wrist slightly off the edge of the table, curl wrist up and back down.      3) Radial Deviations  Start with forearm flat against a table, wrist hanging slightly off the edge, and palm facing the wall. Bending at the wrist only, and keeping palm facing the wall, bend wrist so fist is pointing towards the floor, back up to start position, and up towards the ceiling. Return to start.        4) WRIST PRONATION  Turn your forearm towards palm face down.  Keep your elbow bent and by the side of your  Body.      5) WRIST SUPINATION  Turn your forearm towards palm face up.  Keep your elbow bent and by the side of your  Body.      *Complete exercises ______ times each, _______ times per day*  Complete each exercise 10-15X, 2-3X/day  1) Towel crunch Place a small towel on a firm table top. Flatten out the towel and then place your hand on one end of it.  Next, flex your fingers 2-5 (index finger through pinky finger) as you pull the towel towards your hand.    2) Digit composite flexion/adduction (make a fist) Hold your hand up as shown. Open and close your hand into a fist and repeat. If you cannot make a full fist, then make a partial fist.    3) Thumb/finger opposition Touch the tip of the thumb to each fingertip one by one. Extend fingers fully after they are touched.      4) Finger Taps Start with the hand flat and fingers slightly spread.  One at a time, starting with the thumb, lift each finger up separately.    5) PIP Joint Blocking Grasp the affected finger, bracing below the middle knuckle, and actively bend the finger as shown.    6) DIP Joint Blocking Grasp the affected finger, bracing below the  last knuckle, and actively bend the finger at the last joint.     7) Digit Abduction/Adduction Hold hand palm down flat on table. Spread your fingers apart and back together.

## 2024-04-12 ENCOUNTER — Ambulatory Visit (HOSPITAL_COMMUNITY)

## 2024-04-12 ENCOUNTER — Encounter (HOSPITAL_COMMUNITY): Payer: Self-pay

## 2024-04-12 DIAGNOSIS — R2689 Other abnormalities of gait and mobility: Secondary | ICD-10-CM

## 2024-04-12 DIAGNOSIS — M6281 Muscle weakness (generalized): Secondary | ICD-10-CM

## 2024-04-12 DIAGNOSIS — R471 Dysarthria and anarthria: Secondary | ICD-10-CM | POA: Diagnosis not present

## 2024-04-12 DIAGNOSIS — Z7409 Other reduced mobility: Secondary | ICD-10-CM

## 2024-04-12 DIAGNOSIS — R41841 Cognitive communication deficit: Secondary | ICD-10-CM | POA: Diagnosis not present

## 2024-04-12 DIAGNOSIS — R278 Other lack of coordination: Secondary | ICD-10-CM | POA: Diagnosis not present

## 2024-04-12 DIAGNOSIS — R29818 Other symptoms and signs involving the nervous system: Secondary | ICD-10-CM | POA: Diagnosis not present
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
## 2024-04-19 ENCOUNTER — Ambulatory Visit (HOSPITAL_COMMUNITY): Admitting: Occupational Therapy

## 2024-04-20 ENCOUNTER — Encounter (HOSPITAL_COMMUNITY): Payer: Self-pay | Admitting: Occupational Therapy

## 2024-04-20 ENCOUNTER — Ambulatory Visit (HOSPITAL_COMMUNITY): Admitting: Occupational Therapy

## 2024-04-20 DIAGNOSIS — R29818 Other symptoms and signs involving the nervous system: Secondary | ICD-10-CM | POA: Insufficient documentation

## 2024-04-20 DIAGNOSIS — R2689 Other abnormalities of gait and mobility: Secondary | ICD-10-CM | POA: Diagnosis present

## 2024-04-20 DIAGNOSIS — R278 Other lack of coordination: Secondary | ICD-10-CM | POA: Diagnosis present

## 2024-04-20 DIAGNOSIS — Z7409 Other reduced mobility: Secondary | ICD-10-CM | POA: Insufficient documentation

## 2024-04-20 DIAGNOSIS — M6281 Muscle weakness (generalized): Secondary | ICD-10-CM | POA: Diagnosis present
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
## 2024-04-22 ENCOUNTER — Encounter (HOSPITAL_COMMUNITY): Payer: Self-pay | Admitting: Occupational Therapy

## 2024-04-22 ENCOUNTER — Ambulatory Visit (HOSPITAL_COMMUNITY): Admitting: Occupational Therapy

## 2024-04-22 ENCOUNTER — Ambulatory Visit (HOSPITAL_COMMUNITY)

## 2024-04-22 ENCOUNTER — Encounter (HOSPITAL_COMMUNITY): Payer: Self-pay

## 2024-04-22 DIAGNOSIS — R29818 Other symptoms and signs involving the nervous system: Secondary | ICD-10-CM

## 2024-04-22 DIAGNOSIS — M6281 Muscle weakness (generalized): Secondary | ICD-10-CM

## 2024-04-22 DIAGNOSIS — R278 Other lack of coordination: Secondary | ICD-10-CM

## 2024-04-22 DIAGNOSIS — R2689 Other abnormalities of gait and mobility: Secondary | ICD-10-CM

## 2024-04-22 DIAGNOSIS — Z7409 Other reduced mobility: Secondary | ICD-10-CM
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
## 2024-04-27 ENCOUNTER — Ambulatory Visit (HOSPITAL_COMMUNITY)

## 2024-04-27 ENCOUNTER — Ambulatory Visit (HOSPITAL_COMMUNITY): Admitting: Occupational Therapy

## 2024-04-29 ENCOUNTER — Ambulatory Visit (HOSPITAL_COMMUNITY)

## 2024-04-29 ENCOUNTER — Encounter (HOSPITAL_COMMUNITY): Payer: Self-pay | Admitting: Occupational Therapy

## 2024-04-29 ENCOUNTER — Ambulatory Visit (HOSPITAL_COMMUNITY): Admitting: Occupational Therapy

## 2024-04-29 ENCOUNTER — Encounter (HOSPITAL_COMMUNITY): Payer: Self-pay

## 2024-04-29 DIAGNOSIS — R2689 Other abnormalities of gait and mobility: Secondary | ICD-10-CM

## 2024-04-29 DIAGNOSIS — R278 Other lack of coordination: Secondary | ICD-10-CM | POA: Diagnosis not present

## 2024-04-29 DIAGNOSIS — R29818 Other symptoms and signs involving the nervous system: Secondary | ICD-10-CM

## 2024-04-29 DIAGNOSIS — M6281 Muscle weakness (generalized): Secondary | ICD-10-CM

## 2024-04-29 DIAGNOSIS — Z7409 Other reduced mobility: Secondary | ICD-10-CM
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
## 2024-05-03 ENCOUNTER — Ambulatory Visit (HOSPITAL_COMMUNITY): Admitting: Occupational Therapy

## 2024-05-03 ENCOUNTER — Ambulatory Visit (HOSPITAL_COMMUNITY)

## 2024-05-03 ENCOUNTER — Encounter (HOSPITAL_COMMUNITY): Payer: Self-pay | Admitting: Occupational Therapy

## 2024-05-03 DIAGNOSIS — Z7409 Other reduced mobility: Secondary | ICD-10-CM

## 2024-05-03 DIAGNOSIS — R29818 Other symptoms and signs involving the nervous system: Secondary | ICD-10-CM

## 2024-05-03 DIAGNOSIS — R278 Other lack of coordination: Secondary | ICD-10-CM

## 2024-05-03 DIAGNOSIS — M6281 Muscle weakness (generalized): Secondary | ICD-10-CM

## 2024-05-03 DIAGNOSIS — R2689 Other abnormalities of gait and mobility: Secondary | ICD-10-CM
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
## 2024-05-06 ENCOUNTER — Ambulatory Visit (HOSPITAL_COMMUNITY): Admitting: Occupational Therapy

## 2024-05-06 ENCOUNTER — Ambulatory Visit (HOSPITAL_COMMUNITY)

## 2024-05-06 ENCOUNTER — Encounter (HOSPITAL_COMMUNITY): Payer: Self-pay | Admitting: Occupational Therapy

## 2024-05-06 DIAGNOSIS — R278 Other lack of coordination: Secondary | ICD-10-CM

## 2024-05-06 DIAGNOSIS — Z7409 Other reduced mobility: Secondary | ICD-10-CM

## 2024-05-06 DIAGNOSIS — M6281 Muscle weakness (generalized): Secondary | ICD-10-CM

## 2024-05-06 DIAGNOSIS — R29818 Other symptoms and signs involving the nervous system: Secondary | ICD-10-CM

## 2024-05-06 DIAGNOSIS — R2689 Other abnormalities of gait and mobility: Secondary | ICD-10-CM
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
## 2024-05-10 ENCOUNTER — Ambulatory Visit (HOSPITAL_COMMUNITY)

## 2024-05-10 ENCOUNTER — Ambulatory Visit (HOSPITAL_COMMUNITY): Admitting: Occupational Therapy

## 2024-05-10 ENCOUNTER — Encounter (HOSPITAL_COMMUNITY): Payer: Self-pay | Admitting: Occupational Therapy

## 2024-05-10 DIAGNOSIS — R29818 Other symptoms and signs involving the nervous system: Secondary | ICD-10-CM

## 2024-05-10 DIAGNOSIS — R278 Other lack of coordination: Secondary | ICD-10-CM | POA: Diagnosis not present

## 2024-05-10 DIAGNOSIS — Z7409 Other reduced mobility: Secondary | ICD-10-CM

## 2024-05-10 DIAGNOSIS — R2689 Other abnormalities of gait and mobility: Secondary | ICD-10-CM

## 2024-05-10 DIAGNOSIS — M6281 Muscle weakness (generalized): Secondary | ICD-10-CM
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
## 2024-05-11 ENCOUNTER — Ambulatory Visit (HOSPITAL_COMMUNITY): Admitting: Occupational Therapy

## 2024-05-11 ENCOUNTER — Encounter (HOSPITAL_COMMUNITY): Payer: Self-pay | Admitting: Occupational Therapy

## 2024-05-11 DIAGNOSIS — R29818 Other symptoms and signs involving the nervous system: Secondary | ICD-10-CM

## 2024-05-11 DIAGNOSIS — R278 Other lack of coordination: Secondary | ICD-10-CM
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
## 2024-05-12 ENCOUNTER — Encounter (HOSPITAL_COMMUNITY): Payer: Self-pay | Admitting: Occupational Therapy

## 2024-05-17 ENCOUNTER — Encounter (HOSPITAL_COMMUNITY): Payer: Self-pay | Admitting: Occupational Therapy

## 2024-05-17 ENCOUNTER — Ambulatory Visit (HOSPITAL_COMMUNITY)

## 2024-05-17 ENCOUNTER — Ambulatory Visit (HOSPITAL_COMMUNITY): Admitting: Occupational Therapy

## 2024-05-17 DIAGNOSIS — R278 Other lack of coordination: Secondary | ICD-10-CM

## 2024-05-17 DIAGNOSIS — R2689 Other abnormalities of gait and mobility: Secondary | ICD-10-CM

## 2024-05-17 DIAGNOSIS — R29818 Other symptoms and signs involving the nervous system: Secondary | ICD-10-CM

## 2024-05-17 DIAGNOSIS — M6281 Muscle weakness (generalized): Secondary | ICD-10-CM

## 2024-05-17 DIAGNOSIS — Z7409 Other reduced mobility: Secondary | ICD-10-CM
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
## 2024-05-19 ENCOUNTER — Ambulatory Visit (HOSPITAL_COMMUNITY): Admitting: Occupational Therapy

## 2024-05-19 ENCOUNTER — Ambulatory Visit (HOSPITAL_COMMUNITY)

## 2024-05-19 ENCOUNTER — Encounter (HOSPITAL_COMMUNITY): Payer: Self-pay | Admitting: Occupational Therapy

## 2024-05-19 ENCOUNTER — Encounter (HOSPITAL_COMMUNITY): Payer: Self-pay

## 2024-05-19 DIAGNOSIS — R278 Other lack of coordination: Secondary | ICD-10-CM

## 2024-05-19 DIAGNOSIS — R29818 Other symptoms and signs involving the nervous system: Secondary | ICD-10-CM

## 2024-05-19 DIAGNOSIS — M6281 Muscle weakness (generalized): Secondary | ICD-10-CM

## 2024-05-19 DIAGNOSIS — Z7409 Other reduced mobility: Secondary | ICD-10-CM
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
## 2024-05-24 ENCOUNTER — Encounter (HOSPITAL_COMMUNITY): Payer: Self-pay | Admitting: Occupational Therapy

## 2024-05-24 ENCOUNTER — Encounter (HOSPITAL_COMMUNITY): Payer: Self-pay

## 2024-05-24 ENCOUNTER — Ambulatory Visit (HOSPITAL_COMMUNITY): Attending: Family Medicine | Admitting: Occupational Therapy

## 2024-05-24 ENCOUNTER — Ambulatory Visit (HOSPITAL_COMMUNITY)

## 2024-05-24 DIAGNOSIS — R278 Other lack of coordination: Secondary | ICD-10-CM | POA: Insufficient documentation

## 2024-05-24 DIAGNOSIS — R29818 Other symptoms and signs involving the nervous system: Secondary | ICD-10-CM | POA: Insufficient documentation

## 2024-05-24 DIAGNOSIS — Z7409 Other reduced mobility: Secondary | ICD-10-CM

## 2024-05-24 DIAGNOSIS — R2689 Other abnormalities of gait and mobility: Secondary | ICD-10-CM | POA: Diagnosis present

## 2024-05-24 DIAGNOSIS — R41841 Cognitive communication deficit: Secondary | ICD-10-CM | POA: Diagnosis present

## 2024-05-24 DIAGNOSIS — M6281 Muscle weakness (generalized): Secondary | ICD-10-CM | POA: Insufficient documentation

## 2024-05-24 DIAGNOSIS — R471 Dysarthria and anarthria: Secondary | ICD-10-CM | POA: Diagnosis present
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
## 2024-05-25 ENCOUNTER — Encounter (HOSPITAL_COMMUNITY): Payer: Self-pay | Admitting: Speech Pathology

## 2024-05-25 ENCOUNTER — Ambulatory Visit (HOSPITAL_COMMUNITY): Admitting: Speech Pathology

## 2024-05-25 DIAGNOSIS — R471 Dysarthria and anarthria: Secondary | ICD-10-CM

## 2024-05-25 DIAGNOSIS — R278 Other lack of coordination: Secondary | ICD-10-CM | POA: Diagnosis not present

## 2024-05-25 DIAGNOSIS — R41841 Cognitive communication deficit: Secondary | ICD-10-CM
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
## 2024-05-27 ENCOUNTER — Ambulatory Visit (HOSPITAL_COMMUNITY): Admitting: Occupational Therapy

## 2024-05-27 ENCOUNTER — Ambulatory Visit (HOSPITAL_COMMUNITY)

## 2024-05-27 ENCOUNTER — Encounter (HOSPITAL_COMMUNITY): Payer: Self-pay

## 2024-05-27 ENCOUNTER — Encounter (HOSPITAL_COMMUNITY): Payer: Self-pay | Admitting: Occupational Therapy

## 2024-05-27 DIAGNOSIS — R278 Other lack of coordination: Secondary | ICD-10-CM | POA: Diagnosis not present

## 2024-05-27 DIAGNOSIS — Z7409 Other reduced mobility: Secondary | ICD-10-CM

## 2024-05-27 DIAGNOSIS — R2689 Other abnormalities of gait and mobility: Secondary | ICD-10-CM

## 2024-05-27 DIAGNOSIS — M6281 Muscle weakness (generalized): Secondary | ICD-10-CM

## 2024-05-27 DIAGNOSIS — R29818 Other symptoms and signs involving the nervous system: Secondary | ICD-10-CM
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
## 2024-05-31 ENCOUNTER — Encounter: Payer: Self-pay | Admitting: Cardiology

## 2024-05-31 ENCOUNTER — Ambulatory Visit: Attending: Cardiology | Admitting: Cardiology

## 2024-05-31 ENCOUNTER — Ambulatory Visit: Admitting: Cardiology

## 2024-05-31 ENCOUNTER — Ambulatory Visit (HOSPITAL_COMMUNITY)

## 2024-05-31 ENCOUNTER — Ambulatory Visit (HOSPITAL_COMMUNITY): Admitting: Occupational Therapy

## 2024-05-31 VITALS — BP 104/72 | HR 66 | Ht 73.0 in

## 2024-05-31 DIAGNOSIS — E782 Mixed hyperlipidemia: Secondary | ICD-10-CM | POA: Diagnosis not present

## 2024-05-31 DIAGNOSIS — I2510 Atherosclerotic heart disease of native coronary artery without angina pectoris: Secondary | ICD-10-CM

## 2024-05-31 NOTE — Patient Instructions (Addendum)
Medication Instructions:  Your physician recommends that you continue on your current medications as directed. Please refer to the Current Medication list given to you today.   Labwork: None today  Testing/Procedures: None today  Follow-Up: 6 months Dr.Branch  Any Other Special Instructions Will Be Listed Below (If Applicable).  If you need a refill on your cardiac medications before your next appointment, please call your pharmacy.
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
## 2024-05-31 NOTE — Progress Notes (Signed)
 "     Clinical Summary Seth Higgins is a 84 y.o.male seen today as a new consult, referred by Dr Toribio for the following medical problems.    Previously followed by cardiology at [facility]  1.CAD 2011 NSTEMI, RCA stenosis treated DES at [facility] 2013 cath patent coronaries - prior NSTEMI 11/2021, received DES to RCA at [facility], OM1 high grade stenosis treated medically with plans for PCI if recurrent symptoms 11/2021 echo: LVEF 55-60%  - no recent chest pains. No SOB/DOE - compliant with meds meds.   2.HLD -- symptoms on high dose statin, lowered to crestor  10mg . Was lowered in 2023 - 11/2023 TC 104 TG 107 HDL 38 LDL 51  4. Prior CVA with hemorrhagic conversation 11/2023 - now walks with assistance, left sided weakness. Working with PT   Past Medical History:  Diagnosis Date   CAD (coronary artery disease)    NSTEMI/DES RCA, 05/11. EF 50%,inferoapical HK   Cancer (HCC) 1999   Colon Cancer   Dyslipidemia    History of colon cancer    pre-cancer   Post herpetic neuralgia 10/17/2016   Left C2-3     Allergies[1]   Current Outpatient Medications  Medication Sig Dispense Refill   acetaminophen  (TYLENOL ) 500 MG tablet Take 500 mg by mouth every 6 (six) hours as needed.     amitriptyline  (ELAVIL ) 10 MG tablet Take 2 tablets (20 mg total) by mouth at bedtime. 180 tablet 0   aspirin  EC 81 MG tablet Take 81 mg by mouth daily. Swallow whole.     clopidogrel  (PLAVIX ) 75 MG tablet Take 75 mg by mouth daily.     DULoxetine (CYMBALTA) 30 MG capsule Take 30 mg by mouth 2 (two) times daily.     famotidine (PEPCID) 20 MG tablet Take 20 mg by mouth at bedtime.     gabapentin  (NEURONTIN ) 800 MG tablet Take 1 tablet (800 mg total) by mouth 3 (three) times daily. 270 tablet 0   ibuprofen (ADVIL) 200 MG tablet Take 200 mg by mouth 2 (two) times daily.     Lidocaine  4 % LOTN Apply 1 g topically 3 (three) times daily as needed. (Patient taking differently: Apply 1 g topically 3 (three) times daily as needed  (pain from shingles on neck).) 1 Bottle 5   loratadine (CLARITIN) 10 MG tablet Take 10 mg by mouth daily.     Multiple Vitamins-Minerals (MULTIVITAMIN ADULTS PO) Take 1 tablet by mouth daily.     nitroGLYCERIN  (NITROSTAT ) 0.4 MG SL tablet Place 1 tablet (0.4 mg total) under the tongue every 5 (five) minutes as needed. 25 tablet 3   Omega-3 Fatty Acids (FISH OIL) 1000 MG CAPS Take 1 capsule by mouth daily.     rosuvastatin  (CRESTOR ) 40 MG tablet Take 10 mg by mouth at bedtime.   4   No current facility-administered medications for this visit.     Past Surgical History:  Procedure Laterality Date   CARDIAC CATHETERIZATION  09/2009   with stent placement   COLON SURGERY  1999   COLONOSCOPY [name] 07/01/2012   Procedure: COLONOSCOPY;  Surgeon: Seth Higgins Rivet, Higgins;  [facility];  Service: Endoscopy;  Laterality: [name];  830   COLONOSCOPY [name] 12/10/2017   Procedure: COLONOSCOPY;  Surgeon: Higgins Seth RAYMOND, Higgins;  [facility];  Service: Endoscopy;  Laterality: [name];  830   HERNIA REPAIR     LEFT   MAXILLARY ANTROSTOMY Right 05/18/2018   Procedure: RIGHT MAXILLARY ANTROSTOMY WITH TISSUE REMOVAL;  Surgeon: Seth Seth Higgins;  Location: [facility];  Service: ENT;  Laterality: Right;   NASAL SEPTOPLASTY W/ TURBINOPLASTY [name] 05/18/2018   Procedure: NASAL SEPTOPLASTY WITH TURBINATE REDUCTION;  Surgeon: Seth Seth Higgins;  Location: [facility];  Service: ENT;  Laterality: [name];   POLYPECTOMY  12/10/2017   Procedure: POLYPECTOMY;  Surgeon: Seth Seth PENNER, Higgins;  [facility];  Service: Endoscopy;;  colon   SEPTOPLASTY WITH ETHMOIDECTOMY, AND MAXILLARY ANTROSTOMY Right 05/18/2018   Procedure: SEPTOPLASTY WITH RIGHT ETHMOIDECTOMY AND SPHENOIDECTOMY WITH TISSUE REMOVAL;  Surgeon: Seth Seth Higgins;  Location: [facility];  Service: ENT;  Laterality: Right;   SINUS ENDO W/FUSION Right 05/18/2018   Procedure: ENDOSCOPIC SINUS SURGERY WITH NAVIGATION;   Surgeon: Seth Seth Higgins;  Location: [facility];  Service: ENT;  Laterality: Right;     Allergies[2]    Family History  Problem Relation Age of Onset   Coronary artery disease Father        had CABG in 1970   Colon cancer Father      Social History Seth Higgins reports that he quit smoking about 56 years ago. His smoking use included cigarettes. He started smoking about 66 years ago. He has a 10 pack-year smoking history. He has never used smokeless tobacco. Seth Higgins reports no history of alcohol  use.    Physical Examination Today's Vitals   05/31/24 1041  BP: 104/72  Pulse: 66  SpO2: 97%  Height: 6' 1 (1.854 m)   Body mass index is 25.07 kg/m.  Gen: resting comfortably, no acute distress HEENT: no scleral icterus, pupils equal round and reactive, no palptable cervical adenopathy,  CV: RRR, no m/rg no jvd Resp: Clear to auscultation bilaterally GI: abdomen is soft, non-tender, non-distended, normal bowel sounds, no hepatosplenomegaly MSK: extremities are warm, no edema.  Skin: warm, no rash Neuro:  no focal deficits Psych: appropriate affect   Diagnostic Studies Left heart Cath: 12/03/2021 CONCLUSIONS: - Successful PCI to the mid RCA with a Synergy 3.50x16 mm stent deployed - 2 vessel coronary artery disease including mid RCA 90% stenosis and 80-90% OM1 stenosis. - Normal LVEDP - Normal LV systolic function   Echo: 12/04/21 Summary 1. The left ventricle is normal in size with normal wall thickness. 2. The left ventricular systolic function is normal, LVEF is visually estimated at 55-60%. 3. The right ventricle is upper normal in size, with normal systolic function.   Assessment and Plan  1.CAD - no recent symptoms - continue current medical therapy - EKG today shows SR, no acute ischemic changes  2. HLD - LDL goal <55, his lipids are well controlled         Seth Higgins, M.D.     [1] No Known Allergies [2] No Known  Allergies  "

## 2024-06-03 ENCOUNTER — Ambulatory Visit (HOSPITAL_COMMUNITY): Admitting: Occupational Therapy

## 2024-06-03 ENCOUNTER — Ambulatory Visit (HOSPITAL_COMMUNITY)

## 2024-06-03 ENCOUNTER — Encounter (HOSPITAL_COMMUNITY): Payer: Self-pay

## 2024-06-03 DIAGNOSIS — R2689 Other abnormalities of gait and mobility: Secondary | ICD-10-CM

## 2024-06-03 DIAGNOSIS — R278 Other lack of coordination: Secondary | ICD-10-CM | POA: Diagnosis not present

## 2024-06-03 DIAGNOSIS — M6281 Muscle weakness (generalized): Secondary | ICD-10-CM

## 2024-06-03 DIAGNOSIS — Z7409 Other reduced mobility: Secondary | ICD-10-CM
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
## 2024-06-07 ENCOUNTER — Ambulatory Visit (HOSPITAL_COMMUNITY)

## 2024-06-07 ENCOUNTER — Encounter (HOSPITAL_COMMUNITY): Payer: Self-pay

## 2024-06-07 DIAGNOSIS — Z7409 Other reduced mobility: Secondary | ICD-10-CM

## 2024-06-07 DIAGNOSIS — R2689 Other abnormalities of gait and mobility: Secondary | ICD-10-CM

## 2024-06-07 DIAGNOSIS — M6281 Muscle weakness (generalized): Secondary | ICD-10-CM

## 2024-06-07 DIAGNOSIS — R278 Other lack of coordination: Secondary | ICD-10-CM | POA: Diagnosis not present
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
## 2024-06-10 ENCOUNTER — Encounter (HOSPITAL_COMMUNITY): Payer: Self-pay | Admitting: Occupational Therapy

## 2024-06-10 ENCOUNTER — Ambulatory Visit (HOSPITAL_COMMUNITY)

## 2024-06-10 ENCOUNTER — Ambulatory Visit (HOSPITAL_COMMUNITY): Admitting: Occupational Therapy

## 2024-06-10 ENCOUNTER — Encounter (HOSPITAL_COMMUNITY): Payer: Self-pay

## 2024-06-10 DIAGNOSIS — R278 Other lack of coordination: Secondary | ICD-10-CM

## 2024-06-10 DIAGNOSIS — R29818 Other symptoms and signs involving the nervous system: Secondary | ICD-10-CM

## 2024-06-10 DIAGNOSIS — Z7409 Other reduced mobility: Secondary | ICD-10-CM

## 2024-06-10 DIAGNOSIS — R2689 Other abnormalities of gait and mobility: Secondary | ICD-10-CM

## 2024-06-10 DIAGNOSIS — M6281 Muscle weakness (generalized): Secondary | ICD-10-CM
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
## 2024-06-14 ENCOUNTER — Ambulatory Visit (HOSPITAL_COMMUNITY)

## 2024-06-15 ENCOUNTER — Ambulatory Visit (HOSPITAL_COMMUNITY): Admitting: Occupational Therapy

## 2024-06-17 ENCOUNTER — Ambulatory Visit (HOSPITAL_COMMUNITY)

## 2024-06-21 ENCOUNTER — Ambulatory Visit (HOSPITAL_COMMUNITY)

## 2024-06-22 ENCOUNTER — Ambulatory Visit (HOSPITAL_COMMUNITY): Admitting: Occupational Therapy

## 2024-06-24 ENCOUNTER — Ambulatory Visit (HOSPITAL_COMMUNITY)

## 2024-06-28 ENCOUNTER — Ambulatory Visit (HOSPITAL_COMMUNITY)

## 2024-06-29 ENCOUNTER — Ambulatory Visit (HOSPITAL_COMMUNITY): Admitting: Occupational Therapy

## 2024-07-02 ENCOUNTER — Ambulatory Visit (HOSPITAL_COMMUNITY): Admitting: Occupational Therapy

## 2024-07-06 ENCOUNTER — Ambulatory Visit (HOSPITAL_COMMUNITY): Admitting: Occupational Therapy

## 2024-07-08 ENCOUNTER — Ambulatory Visit (HOSPITAL_COMMUNITY): Admitting: Occupational Therapy

## 2024-07-12 ENCOUNTER — Ambulatory Visit (HOSPITAL_COMMUNITY): Admitting: Occupational Therapy

## 2024-07-14 ENCOUNTER — Ambulatory Visit (HOSPITAL_COMMUNITY): Admitting: Occupational Therapy
